# Patient Record
Sex: Female | Born: 1990 | Race: Black or African American | Hispanic: No | Marital: Single | Smoking: Former smoker
Health system: Southern US, Community
[De-identification: ages and names within clinical notes are randomized; demographics above are authoritative.]

## PROBLEM LIST (undated history)

## (undated) ENCOUNTER — Inpatient Hospital Stay (HOSPITAL_COMMUNITY): Payer: Self-pay

## (undated) DIAGNOSIS — E669 Obesity, unspecified: Secondary | ICD-10-CM

## (undated) DIAGNOSIS — R51 Headache: Secondary | ICD-10-CM

## (undated) DIAGNOSIS — I10 Essential (primary) hypertension: Secondary | ICD-10-CM

## (undated) DIAGNOSIS — F319 Bipolar disorder, unspecified: Secondary | ICD-10-CM

---
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
## 2008-06-15 ENCOUNTER — Ambulatory Visit: Payer: Self-pay | Admitting: Family Medicine

## 2008-07-19 ENCOUNTER — Emergency Department (HOSPITAL_COMMUNITY): Admission: EM | Admit: 2008-07-19 | Discharge: 2008-07-19 | Payer: Self-pay | Admitting: Emergency Medicine

## 2008-08-30 ENCOUNTER — Ambulatory Visit: Payer: Self-pay | Admitting: Family Medicine

## 2008-08-30 LAB — CONVERTED CEMR LAB
Bilirubin Urine: NEGATIVE
Glucose, Urine, Semiquant: NEGATIVE
Ketones, urine, test strip: NEGATIVE
Nitrite: NEGATIVE
Protein, U semiquant: NEGATIVE
RBC / HPF: >20
Specific Gravity, Urine: 1.02
Urobilinogen, UA: 0.2
WBC, UA: >20 cells/hpf
pH: 7

## 2009-05-15 ENCOUNTER — Ambulatory Visit: Payer: Self-pay | Admitting: Family Medicine

## 2009-05-15 LAB — CONVERTED CEMR LAB: Beta hcg, urine, semiquantitative: POSITIVE

## 2009-05-16 ENCOUNTER — Ambulatory Visit: Payer: Self-pay | Admitting: Family Medicine

## 2009-05-16 ENCOUNTER — Encounter: Payer: Self-pay | Admitting: Family Medicine

## 2009-05-16 LAB — CONVERTED CEMR LAB
ABO/RH(D): B POS
Antibody Screen: NEGATIVE
Basophils Absolute: 0 10*3/uL (ref 0.0–0.1)
Basophils Relative: 0 % (ref 0–1)
Eosinophils Absolute: 0.1 10*3/uL (ref 0.0–0.7)
Eosinophils Relative: 1 % (ref 0–5)
HCT: 39.5 % (ref 36.0–46.0)
Hemoglobin: 13 g/dL (ref 12.0–15.0)
Hepatitis B Surface Ag: NEGATIVE
Lymphocytes Relative: 35 % (ref 12–46)
Lymphs Abs: 2.8 10*3/uL (ref 0.7–4.0)
MCHC: 32.9 g/dL (ref 30.0–36.0)
MCV: 83.5 fL (ref 78.0–100.0)
Monocytes Absolute: 0.6 10*3/uL (ref 0.1–1.0)
Monocytes Relative: 7 % (ref 3–12)
Neutro Abs: 4.5 10*3/uL (ref 1.7–7.7)
Neutrophils Relative %: 57 % (ref 43–77)
Platelets: 355 10*3/uL (ref 150–400)
RBC: 4.73 M/uL (ref 3.87–5.11)
RDW: 14 % (ref 11.5–15.5)
Rh Type: POSITIVE
Rubella: 117.6 intl units/mL — ABNORMAL HIGH
Sickle Cell Screen: NEGATIVE
WBC: 8 10*3/uL (ref 4.0–10.5)

## 2009-05-20 ENCOUNTER — Telehealth: Payer: Self-pay | Admitting: Family Medicine

## 2009-05-21 ENCOUNTER — Ambulatory Visit: Payer: Self-pay | Admitting: Family Medicine

## 2009-05-21 ENCOUNTER — Telehealth: Payer: Self-pay | Admitting: Family Medicine

## 2009-06-13 ENCOUNTER — Ambulatory Visit: Payer: Self-pay | Admitting: Family Medicine

## 2009-06-13 ENCOUNTER — Encounter: Payer: Self-pay | Admitting: Family Medicine

## 2009-06-13 LAB — CONVERTED CEMR LAB
Chlamydia, DNA Probe: NEGATIVE
GC Probe Amp, Genital: NEGATIVE
Whiff Test: POSITIVE

## 2009-07-03 ENCOUNTER — Ambulatory Visit: Payer: Self-pay | Admitting: Family Medicine

## 2009-07-09 ENCOUNTER — Encounter: Payer: Self-pay | Admitting: Family Medicine

## 2009-07-09 ENCOUNTER — Ambulatory Visit: Payer: Self-pay | Admitting: Family Medicine

## 2009-07-31 ENCOUNTER — Ambulatory Visit (HOSPITAL_COMMUNITY): Admission: RE | Admit: 2009-07-31 | Discharge: 2009-07-31 | Payer: Self-pay | Admitting: Family Medicine

## 2009-07-31 ENCOUNTER — Encounter: Payer: Self-pay | Admitting: Family Medicine

## 2009-08-04 ENCOUNTER — Emergency Department (HOSPITAL_COMMUNITY): Admission: EM | Admit: 2009-08-04 | Discharge: 2009-08-05 | Payer: Self-pay | Admitting: Internal Medicine

## 2009-08-05 ENCOUNTER — Emergency Department (HOSPITAL_COMMUNITY): Admission: EM | Admit: 2009-08-05 | Discharge: 2009-08-05 | Payer: Self-pay | Admitting: Internal Medicine

## 2009-08-08 ENCOUNTER — Ambulatory Visit: Payer: Self-pay | Admitting: Family Medicine

## 2009-08-21 ENCOUNTER — Encounter: Payer: Self-pay | Admitting: Family Medicine

## 2009-08-21 ENCOUNTER — Telehealth: Payer: Self-pay | Admitting: Family Medicine

## 2009-08-21 ENCOUNTER — Ambulatory Visit: Payer: Self-pay | Admitting: Family Medicine

## 2009-08-21 ENCOUNTER — Ambulatory Visit (HOSPITAL_COMMUNITY): Admission: RE | Admit: 2009-08-21 | Discharge: 2009-08-21 | Payer: Self-pay | Admitting: Family Medicine

## 2009-09-09 ENCOUNTER — Ambulatory Visit: Payer: Self-pay | Admitting: Family Medicine

## 2009-10-11 ENCOUNTER — Ambulatory Visit: Payer: Self-pay | Admitting: Family Medicine

## 2009-10-11 ENCOUNTER — Encounter: Payer: Self-pay | Admitting: Family Medicine

## 2009-10-11 LAB — CONVERTED CEMR LAB
ALT: 14 units/L (ref 0–35)
AST: 15 units/L (ref 0–37)
Albumin: 3.3 g/dL — ABNORMAL LOW (ref 3.5–5.2)
Alkaline Phosphatase: 57 units/L (ref 39–117)
BUN: 7 mg/dL (ref 6–23)
Bilirubin Urine: NEGATIVE
CO2: 20 meq/L (ref 19–32)
Calcium: 8.7 mg/dL (ref 8.4–10.5)
Chloride: 104 meq/L (ref 96–112)
Creatinine, Ser: 0.49 mg/dL (ref 0.40–1.20)
Glucose, Bld: 98 mg/dL (ref 70–99)
Glucose, Urine, Semiquant: NEGATIVE
HCT: 35.4 % — ABNORMAL LOW (ref 36.0–46.0)
Hemoglobin: 11.5 g/dL — ABNORMAL LOW (ref 12.0–15.0)
Ketones, urine, test strip: NEGATIVE
MCHC: 32.5 g/dL (ref 30.0–36.0)
MCV: 84.7 fL (ref 78.0–100.0)
Nitrite: NEGATIVE
Platelets: 240 10*3/uL (ref 150–400)
Potassium: 3.9 meq/L (ref 3.5–5.3)
RBC: 4.18 M/uL (ref 3.87–5.11)
RDW: 14.3 % (ref 11.5–15.5)
Sodium: 135 meq/L (ref 135–145)
Specific Gravity, Urine: >=1.03
Total Bilirubin: 0.2 mg/dL — ABNORMAL LOW (ref 0.3–1.2)
Total Protein: 6.5 g/dL (ref 6.0–8.3)
Urobilinogen, UA: 0.2
WBC: 8.5 10*3/uL (ref 4.0–10.5)
pH: 6

## 2009-10-23 ENCOUNTER — Encounter: Payer: Self-pay | Admitting: Family Medicine

## 2009-10-25 ENCOUNTER — Encounter: Payer: Self-pay | Admitting: Family Medicine

## 2009-10-26 ENCOUNTER — Telehealth: Payer: Self-pay | Admitting: Family Medicine

## 2009-10-28 ENCOUNTER — Encounter: Payer: Self-pay | Admitting: Family Medicine

## 2009-11-06 ENCOUNTER — Encounter: Payer: Self-pay | Admitting: Family Medicine

## 2009-11-13 ENCOUNTER — Ambulatory Visit: Payer: Self-pay | Admitting: Family Medicine

## 2009-11-13 ENCOUNTER — Encounter: Payer: Self-pay | Admitting: Family Medicine

## 2009-11-13 LAB — CONVERTED CEMR LAB
ALT: 21 units/L (ref 0–35)
AST: 15 units/L (ref 0–37)
Albumin: 3.4 g/dL — ABNORMAL LOW (ref 3.5–5.2)
Alkaline Phosphatase: 88 units/L (ref 39–117)
BUN: 8 mg/dL (ref 6–23)
Bilirubin Urine: NEGATIVE
Blood in Urine, dipstick: NEGATIVE
CO2: 20 meq/L (ref 19–32)
Calcium: 8.7 mg/dL (ref 8.4–10.5)
Chloride: 103 meq/L (ref 96–112)
Creatinine, Ser: 0.52 mg/dL (ref 0.40–1.20)
Glucose, Bld: 84 mg/dL (ref 70–99)
Glucose, Urine, Semiquant: NEGATIVE
HCT: 35.2 % — ABNORMAL LOW (ref 36.0–46.0)
Hemoglobin: 11.6 g/dL — ABNORMAL LOW (ref 12.0–15.0)
Ketones, urine, test strip: NEGATIVE
MCHC: 33 g/dL (ref 30.0–36.0)
MCV: 82.6 fL (ref 78.0–100.0)
Nitrite: NEGATIVE
Platelets: 263 10*3/uL (ref 150–400)
Potassium: 4.3 meq/L (ref 3.5–5.3)
Protein, U semiquant: 30
RBC: 4.26 M/uL (ref 3.87–5.11)
RDW: 14.6 % (ref 11.5–15.5)
Sodium: 136 meq/L (ref 135–145)
Specific Gravity, Urine: >=1.03
Total Bilirubin: 0.3 mg/dL (ref 0.3–1.2)
Total Protein: 6.8 g/dL (ref 6.0–8.3)
Urobilinogen, UA: 0.2
WBC Urine, dipstick: NEGATIVE
WBC: 9.1 10*3/uL (ref 4.0–10.5)
pH: 6

## 2009-11-22 ENCOUNTER — Encounter: Payer: Self-pay | Admitting: Family Medicine

## 2009-11-23 ENCOUNTER — Telehealth: Payer: Self-pay | Admitting: Family Medicine

## 2009-11-29 ENCOUNTER — Ambulatory Visit: Payer: Self-pay | Admitting: Family Medicine

## 2009-12-19 ENCOUNTER — Encounter: Payer: Self-pay | Admitting: Family Medicine

## 2009-12-24 ENCOUNTER — Ambulatory Visit: Payer: Self-pay | Admitting: Family Medicine

## 2009-12-25 ENCOUNTER — Encounter: Payer: Self-pay | Admitting: Family Medicine

## 2009-12-25 LAB — CONVERTED CEMR LAB
Chlamydia, DNA Probe: NEGATIVE
GC Probe Amp, Genital: NEGATIVE

## 2009-12-26 ENCOUNTER — Encounter: Payer: Self-pay | Admitting: Family Medicine

## 2009-12-30 ENCOUNTER — Inpatient Hospital Stay (HOSPITAL_COMMUNITY): Admission: AD | Admit: 2009-12-30 | Discharge: 2009-12-30 | Payer: Self-pay | Admitting: Obstetrics & Gynecology

## 2009-12-30 ENCOUNTER — Telehealth: Payer: Self-pay | Admitting: *Deleted

## 2009-12-30 ENCOUNTER — Ambulatory Visit: Payer: Self-pay | Admitting: Family Medicine

## 2010-01-02 ENCOUNTER — Ambulatory Visit: Payer: Self-pay | Admitting: Family Medicine

## 2010-01-02 LAB — CONVERTED CEMR LAB
Glucose, Urine, Semiquant: NEGATIVE
Protein, U semiquant: NEGATIVE

## 2010-01-05 ENCOUNTER — Inpatient Hospital Stay (HOSPITAL_COMMUNITY): Admission: AD | Admit: 2010-01-05 | Discharge: 2010-01-10 | Payer: Self-pay | Admitting: Obstetrics and Gynecology

## 2010-01-05 ENCOUNTER — Ambulatory Visit: Payer: Self-pay | Admitting: Obstetrics & Gynecology

## 2010-01-07 ENCOUNTER — Encounter: Payer: Self-pay | Admitting: Obstetrics and Gynecology

## 2010-01-07 ENCOUNTER — Ambulatory Visit: Payer: Self-pay | Admitting: Family Medicine

## 2010-01-13 ENCOUNTER — Ambulatory Visit: Payer: Self-pay | Admitting: Cardiology

## 2010-01-13 ENCOUNTER — Inpatient Hospital Stay (HOSPITAL_COMMUNITY): Admission: AD | Admit: 2010-01-13 | Discharge: 2010-01-15 | Payer: Self-pay | Admitting: Obstetrics and Gynecology

## 2010-01-13 ENCOUNTER — Ambulatory Visit: Payer: Self-pay | Admitting: Family Medicine

## 2010-01-13 ENCOUNTER — Ambulatory Visit: Payer: Self-pay | Admitting: Obstetrics and Gynecology

## 2010-01-13 LAB — CONVERTED CEMR LAB
Bilirubin Urine: NEGATIVE
Glucose, Urine, Semiquant: NEGATIVE
Protein, U semiquant: 100
WBC Urine, dipstick: NEGATIVE
pH: 6

## 2010-01-14 ENCOUNTER — Telehealth: Payer: Self-pay | Admitting: Family Medicine

## 2010-01-15 ENCOUNTER — Encounter: Payer: Self-pay | Admitting: Obstetrics & Gynecology

## 2010-01-22 ENCOUNTER — Encounter: Payer: Self-pay | Admitting: Family Medicine

## 2010-02-14 ENCOUNTER — Emergency Department (HOSPITAL_COMMUNITY): Admission: EM | Admit: 2010-02-14 | Discharge: 2010-02-14 | Payer: Self-pay | Admitting: Emergency Medicine

## 2010-03-01 ENCOUNTER — Ambulatory Visit: Payer: Self-pay | Admitting: Psychiatry

## 2010-04-14 ENCOUNTER — Ambulatory Visit: Payer: Self-pay | Admitting: Family Medicine

## 2010-04-14 ENCOUNTER — Other Ambulatory Visit: Admission: RE | Admit: 2010-04-14 | Discharge: 2010-04-14 | Payer: Self-pay | Admitting: Family Medicine

## 2010-04-14 DIAGNOSIS — N912 Amenorrhea, unspecified: Secondary | ICD-10-CM | POA: Insufficient documentation

## 2010-04-22 ENCOUNTER — Telehealth: Payer: Self-pay | Admitting: *Deleted

## 2010-06-16 ENCOUNTER — Ambulatory Visit: Admit: 2010-06-16 | Payer: Self-pay

## 2010-06-16 ENCOUNTER — Ambulatory Visit
Admission: RE | Admit: 2010-06-16 | Discharge: 2010-06-16 | Payer: Self-pay | Source: Home / Self Care | Attending: Family Medicine | Admitting: Family Medicine

## 2010-06-16 DIAGNOSIS — G43909 Migraine, unspecified, not intractable, without status migrainosus: Secondary | ICD-10-CM | POA: Insufficient documentation

## 2010-06-20 ENCOUNTER — Emergency Department (HOSPITAL_COMMUNITY)
Admission: EM | Admit: 2010-06-20 | Discharge: 2010-06-20 | Payer: Self-pay | Source: Home / Self Care | Admitting: Family Medicine

## 2010-06-23 LAB — URINE CULTURE
Colony Count: 15000
Culture  Setup Time: 201201131729

## 2010-06-23 LAB — POCT URINALYSIS DIPSTICK
Bilirubin Urine: NEGATIVE
Ketones, ur: NEGATIVE mg/dL
Nitrite: NEGATIVE
Protein, ur: 30 mg/dL — AB
Specific Gravity, Urine: <=1.005 (ref 1.005–1.030)
Urine Glucose, Fasting: NEGATIVE mg/dL
Urobilinogen, UA: 0.2 mg/dL (ref 0.0–1.0)
pH: 6 (ref 5.0–8.0)

## 2010-06-23 LAB — POCT PREGNANCY, URINE: Preg Test, Ur: NEGATIVE

## 2010-06-25 ENCOUNTER — Emergency Department (HOSPITAL_COMMUNITY)
Admission: EM | Admit: 2010-06-25 | Discharge: 2010-06-25 | Payer: Self-pay | Source: Home / Self Care | Admitting: Emergency Medicine

## 2010-06-30 ENCOUNTER — Ambulatory Visit: Admit: 2010-06-30 | Payer: Self-pay

## 2010-06-30 LAB — URINALYSIS, ROUTINE W REFLEX MICROSCOPIC
Bilirubin Urine: NEGATIVE
Ketones, ur: NEGATIVE mg/dL
Leukocytes, UA: NEGATIVE
Nitrite: NEGATIVE
Protein, ur: NEGATIVE mg/dL
Specific Gravity, Urine: 1.005 (ref 1.005–1.030)
Urine Glucose, Fasting: NEGATIVE mg/dL
Urobilinogen, UA: 0.2 mg/dL (ref 0.0–1.0)
pH: 6 (ref 5.0–8.0)

## 2010-06-30 LAB — POCT PREGNANCY, URINE: Preg Test, Ur: NEGATIVE

## 2010-06-30 LAB — COMPREHENSIVE METABOLIC PANEL
ALT: 11 U/L (ref 0–35)
AST: 13 U/L (ref 0–37)
Albumin: 3.3 g/dL — ABNORMAL LOW (ref 3.5–5.2)
Alkaline Phosphatase: 45 U/L (ref 39–117)
BUN: 16 mg/dL (ref 6–23)
CO2: 25 mEq/L (ref 19–32)
Calcium: 8.5 mg/dL (ref 8.4–10.5)
Chloride: 108 mEq/L (ref 96–112)
Creatinine, Ser: 2.09 mg/dL — ABNORMAL HIGH (ref 0.4–1.2)
GFR calc Af Amer: 36 mL/min — ABNORMAL LOW (ref >60)
GFR calc non Af Amer: 30 mL/min — ABNORMAL LOW (ref >60)
Glucose, Bld: 92 mg/dL (ref 70–99)
Potassium: 4.1 mEq/L (ref 3.5–5.1)
Sodium: 141 mEq/L (ref 135–145)
Total Bilirubin: 0.2 mg/dL — ABNORMAL LOW (ref 0.3–1.2)
Total Protein: 7.2 g/dL (ref 6.0–8.3)

## 2010-06-30 LAB — DIFFERENTIAL
Basophils Absolute: 0.1 10*3/uL (ref 0.0–0.1)
Basophils Relative: 1 % (ref 0–1)
Eosinophils Absolute: 0.1 10*3/uL (ref 0.0–0.7)
Eosinophils Relative: 2 % (ref 0–5)
Lymphocytes Relative: 38 % (ref 12–46)
Lymphs Abs: 2.6 10*3/uL (ref 0.7–4.0)
Monocytes Absolute: 0.6 10*3/uL (ref 0.1–1.0)
Monocytes Relative: 9 % (ref 3–12)
Neutro Abs: 3.5 10*3/uL (ref 1.7–7.7)
Neutrophils Relative %: 51 % (ref 43–77)

## 2010-06-30 LAB — URINE MICROSCOPIC-ADD ON

## 2010-06-30 LAB — GLUCOSE, CAPILLARY: Glucose-Capillary: 99 mg/dL (ref 70–99)

## 2010-06-30 LAB — WET PREP, GENITAL
Clue Cells Wet Prep HPF POC: NONE SEEN
Trich, Wet Prep: NONE SEEN
Yeast Wet Prep HPF POC: NONE SEEN

## 2010-06-30 LAB — CBC
HCT: 34.5 % — ABNORMAL LOW (ref 36.0–46.0)
Hemoglobin: 11.2 g/dL — ABNORMAL LOW (ref 12.0–15.0)
MCH: 26.9 pg (ref 26.0–34.0)
MCHC: 32.5 g/dL (ref 30.0–36.0)
MCV: 82.7 fL (ref 78.0–100.0)
Platelets: 307 10*3/uL (ref 150–400)
RBC: 4.17 MIL/uL (ref 3.87–5.11)
RDW: 13.3 % (ref 11.5–15.5)
WBC: 6.9 10*3/uL (ref 4.0–10.5)

## 2010-06-30 LAB — GC/CHLAMYDIA PROBE AMP, GENITAL
Chlamydia, DNA Probe: NEGATIVE
GC Probe Amp, Genital: NEGATIVE

## 2010-06-30 LAB — LIPASE, BLOOD: Lipase: 23 U/L (ref 11–59)

## 2010-07-01 ENCOUNTER — Ambulatory Visit: Admission: RE | Admit: 2010-07-01 | Discharge: 2010-07-01 | Payer: Self-pay | Source: Home / Self Care

## 2010-07-01 ENCOUNTER — Encounter: Payer: Self-pay | Admitting: Family Medicine

## 2010-07-01 DIAGNOSIS — N179 Acute kidney failure, unspecified: Secondary | ICD-10-CM | POA: Insufficient documentation

## 2010-07-01 LAB — CONVERTED CEMR LAB
Ketones, urine, test strip: NEGATIVE
Nitrite: NEGATIVE
Urobilinogen, UA: 0.2

## 2010-07-02 ENCOUNTER — Encounter: Payer: Self-pay | Admitting: Family Medicine

## 2010-07-02 LAB — CONVERTED CEMR LAB
AST: 12 units/L (ref 0–37)
Albumin: 4.2 g/dL (ref 3.5–5.2)
Alkaline Phosphatase: 50 units/L (ref 39–117)
Glucose, Bld: 82 mg/dL (ref 70–99)
MCHC: 32.3 g/dL (ref 30.0–36.0)
Potassium: 4.2 meq/L (ref 3.5–5.3)
RDW: 13.8 % (ref 11.5–15.5)
Sodium: 139 meq/L (ref 135–145)
Total Protein: 7.7 g/dL (ref 6.0–8.3)

## 2010-07-04 ENCOUNTER — Telehealth: Payer: Self-pay | Admitting: Family Medicine

## 2010-07-08 NOTE — Progress Notes (Signed)
Summary: followup on ?water breaking  Phone Note Outgoing Call   Call placed by: [name] MD Details for Reason: Followup on ?Water breaking Summary of Call: Left message on phone to see if pt ever went to [facility] (nothing documented in echart-not hospitalized), to see if she is acutely in labor. Have attempted to contact on multiple attempts. Left return information for pt to call back if there were any questions or concerns.  [name] MD Oct 26, 2009 2:22 PM

## 2010-07-08 NOTE — Progress Notes (Signed)
Summary: Abd/RUQ pain  Phone Note Call from Patient   Details for Reason: Abdominal Pain Summary of Call: Recieved page/call from pt about pt reporting RUQ/abdominal pain. Pt states that pain started earlier today. Pt denies vaginal bleeding, LOF. Fetal movement at baseline. Pt denies HA, CP, vision change. Pt instructed that if abdominal pain worsens, vaginal bleeding, or fetal activity decreases to go to [facility] ED for evaluation. Pt also instructed to call [facility] line  in future if there are any emergencies or questions. [name] MD November 23, 2009 12:31 AM  Initial call taken by: [name] MD

## 2010-07-08 NOTE — Letter (Signed)
Summary: Handout Printed  Printed Handout:  - Prenatal-Record-CCC 

## 2010-07-08 NOTE — Assessment & Plan Note (Signed)
Summary: ob/kh   Vital Signs:  Patient profile:   20 year old female Weight:      282 pounds BP sitting:   104 / 60  Vitals Entered By: Arlyss Repress CMA, (August 08, 2009 2:16 PM)  Habits & Providers  Alcohol-Tobacco-Diet     Cigarette Packs/Day: n/a  Allergies: No Known Drug Allergies  Social History: Packs/Day:  n/a   Impression & Recommendations:  Problem # 1:  SUPERVISION OF NORMAL FIRST PREGNANCY (ICD-V22.0) 19 YO G1P0 here at 18.4 weeks dated by u/s @ 17.3 secondary to unsure LMP here  for routine prenatal visit. Pt w/o complaints or questions. Pt denies any headache, vision changes, vaginal bleeding. Pt does report minimal non-odorous vaginal discharge, minimal LE edema. Good fetal mov't/activity per pt.  Serologies: B+, Ab neg, H&H 13/39.5, HBsAg neg, RPR neg, RI, Sickle Cell neg, HIV NR, GC/Chl neg Feeding: breast Baby: female, outpt circ Pt educated about PTL, depression screen negative, plan for repeat u/s in 1-2 weeks for better cardiac visualization on u/s per ultrasound recommendations.   Orders: Prenatal U/S > 14 weeks - 66063 (Prenatal U/S) [facility]- Est Level  3 (01601)  Complete Medication List: 1)  Prenatal Vitamins 0.8 Mg Tabs (Prenatal multivit-min-fe-fa) .... One tab by mouth daily 2)  Tamiflu 75 Mg Caps (Oseltamivir phosphate) .... One by mouth two times a day x 5 days 3)  Pseudoephedrine Hcl 30 Mg Tabs (Pseudoephedrine hcl) .... One by mouth every 6 hours as needed for congestion   OB Initial Intake Information    Race: Black    Marital status: Single    Occupation: student  FOB Information    Husband/Father of baby: Karren Burly    FOB occupation unemployed    Phone: no phone  Menstrual History    LMP (date): 03/24/2009    Best Working EDC: 01/05/2010    LMP - Character: normal    LMP - Reliable? : approximate (month known)    Menarche: 10 years    Menses interval: 28 days    Menstrual flow 6-7 days    On BCP's at conception: no    Date  of positive (+) home preg. test: 05/22/2009   Flowsheet View for Follow-up Visit    Estimated weeks of       gestation:     18 4/7    Weight:     282    Blood pressure:   104 / 60    Headache:     No    Nausea/vomiting:   nausea    Edema:     0    Vaginal bleeding:   no    Vaginal discharge:   d/c    Fundal height:      17.5    FHR:       140s    Fetal activity:     yes    Labor symptoms:   no    Taking prenatal vits?   Y    Smoking:     n/a    Next visit:     4 wk    Resident:     Alvester Morin

## 2010-07-08 NOTE — Assessment & Plan Note (Signed)
Summary: open sore on neck/[place]/bolden   Vital Signs:  Patient profile:   20 year old female Height:      65 inches Weight:      289.8 pounds BMI:     48.40 Temp:     98.1 degrees F oral Pulse rate:   102 / minute BP sitting:   104 / 69  (right arm) Cuff size:   large  Vitals Entered By: Garen Grams LPN (August 21, 2009 3:57 PM) CC: open sore on neck Is Patient Diabetic? No Pain Assessment Patient in pain? no        Primary Care Provider:  Lequita Asal  MD  CC:  open sore on neck.  History of Present Illness: 1. Abscess on neck:  Pt has noticed a bump on her neck for the past 2 weeks.  Started off as a small bump then has gotten progressively bigger.  It started draining some fluid yesterday.  It is tender to palpation, warm.  It is still draining some fluid      ROS: denies fevers, headache, stiff neck  Current Problems (verified): 1)  Abscess, Skin  (ICD-682.9) 2)  Supervision of Normal First Pregnancy  (ICD-V22.0) 3)  Screening For Malignant Neoplasm of The Cervix  (ICD-V76.2) 4)  Influenza Like Illness  (ICD-487.1) 5)  Diabetes Mellitus, Type II, Family Hx  (ICD-V18.0) 6)  Pregnant State, Incidental  (ICD-V22.2) 7)  Obesity  (ICD-278.00) 8)  Bipolar Depression  (ICD-296.7) 9)  Eczema, Atopic Dermatitis  (ICD-691.8)  Current Medications (verified): 1)  Prenatal Vitamins 0.8 Mg Tabs (Prenatal Multivit-Min-Fe-Fa) .... One Tab By Mouth Daily 2)  Tamiflu 75 Mg Caps (Oseltamivir Phosphate) .... One By Mouth Two Times A Day X 5 Days 3)  Pseudoephedrine Hcl 30 Mg Tabs (Pseudoephedrine Hcl) .... One By Mouth Every 6 Hours As Needed For Congestion 4)  Keflex 500 Mg Caps (Cephalexin) .... Take 1 Tab By Mouth Twice A Day For 7 Days  Allergies: No Known Drug Allergies  Past History:  Past Medical History: Reviewed history from 10/18/2007 and no changes required. bipolar disorder  Social History: Reviewed history from 05/21/2009 and no changes required. 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 denies EtOH, drugs, tobacco.   Physical Exam  General:  vitals reviewed, afebrile.  alert and overweight-appearing.   Head:  normocephalic and atraumatic.   Neck:  supple, full ROM, and no masses.   Lungs:  normal respiratory effort.   Heart:  normal rate and regular rhythm.   Skin:  2x3 cm abscess on the back of the neck.  Draining some pustular material.  No overlying cellulitis.  Indurated area under abscess is approximately 3x4 cm. Additional Exam:  I&D neck abscess:  time out performed.  skin was prepped and draped in normal sterile fashion.  skin was anesthetized with lidocaine.  abscess with incised with sterile blade.  moderate amount of purulent material was expressed.  no packing was placed.  Covered with sterile gauze.   Impression & Recommendations:  Problem # 1:  ABSCESS, SKIN (ICD-682.9) Assessment New s/p I&D.  Will get culture.  Will treat with Keflex since she is pregnant. Her updated medication list for this problem includes:    Keflex 500 Mg Caps (Cephalexin) .Marland Kitchen... Take 1 tab by mouth twice a day for 7 days  Orders: Culture, Wound -[facility] (16109) Gram Stain-[facility] ([id number]) I&D Abcess, simple- [facility] (10060) [facility]- Est Level  3 (19147)  Complete Medication List: 1)  Prenatal Vitamins  0.8 Mg Tabs (Prenatal multivit-min-fe-fa) .... One tab by mouth daily 2)  Tamiflu 75 Mg Caps (Oseltamivir phosphate) .... One by mouth two times a day x 5 days 3)  Pseudoephedrine Hcl 30 Mg Tabs (Pseudoephedrine hcl) .... One by mouth every 6 hours as needed for congestion 4)  Keflex 500 Mg Caps (Cephalexin) .... Take 1 tab by mouth twice a day for 7 days  Patient Instructions: 1)  You have a skin abscess 2)  Your antibiotic is: Keflex 3)  Apply warm compresses to the affected area 3-4 times a day at least for 15-20 minutes. 4)  Do not squeeze the abscess. 5)  Wash your skin with antibacterial soap once a day to prevent these. 6)  Call our  office if it develops red streaks, is very painful, you have a fever, or for any other concerns. 7)  Please schedule a follow up appointment in 2 days to recheck the abscess 8)    Prescriptions: KEFLEX 500 MG CAPS (CEPHALEXIN) Take 1 tab by mouth twice a day for 7 days  #14 x 0   Entered and Authorized by:   Angelena Sole MD   Signed by:   Angelena Sole MD on 08/21/2009   Method used:   Electronically to        [facility] [id number]* (retail)       [address]       [place], [place]  [address]       Ph: [phone]       Fax: [phone]   RxID:   [id number]

## 2010-07-08 NOTE — Assessment & Plan Note (Signed)
Summary: OB F/U/[facility]   Vital Signs:  Patient profile:   20 year old female Height:      65 inches Weight:      296 pounds BMI:     49.44 Temp:     98.3 degrees F oral Pulse rate:   109 / minute BP sitting:   148 / 74  (right arm) Cuff size:   large  Vitals Entered By: Tessie Fass CMA (November 13, 2009 1:53 PM) CC: OB Visit Pain Assessment Patient in pain? no      LMP (date): 03/24/2009 [facility] 01/05/2010   CC:  OB Visit.  Habits & Providers  Alcohol-Tobacco-Diet     Cigarette Packs/Day: n/a  Allergies: No Known Drug Allergies   Impression & Recommendations:  Problem # 1:  SUPERVISION OF NORMAL FIRST PREGNANCY (ICD-V22.0) 20 YO G1P0 here at 32 3/7 w/ EDD 01/05/10 by U/S her for routine prenatal visit. Pt denies abd pain, vaginal bleeding, vaginal discharge, or LOF. No dysuria. Pt denies HA, vision changes, RUQ pain. Pt does report LE swelling. PHQ-9 score of 3. No HI/SI. No tobacco/substance abuse. No domestic violence per patient. Pt with noted 5 lb weight gain since last clinic visit 10/11/2009. Addressed need for diet low in processed/fast food and high in fruits and vegetables as well as overall weight gain goals during pregnancy for patient.. Pt agreeable. Fundal height large for dates in setting of baseline obese female-consistent w/ prior exams. Will continue to follow. Fetal heart tones WNL. Plan to also reassess pt for pre-eclampsia in setting of LE swelling, and weight gain. repeat manual BP 126/68-reassuring. However discussed Pre-X red flags w/ patient at length. Also discussed PTL red flags w/ pt. Pt currently undecided on birth control. Will readdress at next prenatal visit.  Plan to obtain CBC, RPR and HIV in conjunction w/ 3rd trimester labs in addition to Pre-eclampsia labs. Otherwise followup in 2 weeks. Pt agreeable to plan.   ? Water breaking- Pt w/ documented episode of ? water breaking 5/20-Calling in to clinic in tears. Pt states that it was just a false alarm.  She thinks she might have just wet herself... No abd pain, vaginal bleeding. + Fetal activity since ? episode.Marland KitchenMarland KitchenPTL Red flags readdressed w/ pt... Orders: Urinalysis-[facility] (00000) Comp Met-[facility] [id number]) CBC-[facility] [id number]) RPR-[facility] [id number]) HIV-[facility] [id number])  Complete Medication List: 1)  Prenatal Vitamins 0.8 Mg Tabs (Prenatal multivit-min-fe-fa) .... One tab by mouth daily 2)  Tamiflu 75 Mg Caps (Oseltamivir phosphate) .... One by mouth two times a day x 5 days 3)  Pseudoephedrine Hcl 30 Mg Tabs (Pseudoephedrine hcl) .... One by mouth every 6 hours as needed for congestion 4)  Keflex 500 Mg Caps (Cephalexin) .... Take 1 tab by mouth twice a day for 7 days   OB Initial Intake Information    Race: Black    Marital status: Single    Occupation: student  FOB Information    Husband/Father of baby: Karren Burly    FOB occupation unemployed    Phone: no phone  Menstrual History    LMP (date): 03/24/2009    Best Working EDC: 01/05/2010    LMP - Character: normal    Menarche: 10 years    Menses interval: 28 days    Menstrual flow 6-7 days    On BCP's at conception: no    Date of positive (+) home preg. test: 05/22/2009   Flowsheet View for Follow-up Visit    Estimated weeks of  gestation:     32 3/7    Weight:     296    Blood pressure:   148 / 74    Urine Protein:     30    Urine Glucose:   negative    Urine Nitrite:     negative    Headache:     No    Nausea/vomiting:   No    Edema:     1+LE    Vaginal bleeding:   no    Vaginal discharge:   no    Fundal height:      34.5    FHR:       140s    Fetal activity:     yes    Labor symptoms:   no    Taking prenatal vits?   Y    Smoking:     n/a    Next visit:     2 wk    Resident:     SN    Preceptor:     Outside Preceptor  Laboratory Results   Urine Tests  Date/Time Received: November 13, 2009 3:17 PM  Date/Time Reported: November 13, 2009 3:34 PM   Routine Urinalysis   Color: yellow Appearance:  Clear Glucose: negative   (Normal Range: Negative) Bilirubin: negative   (Normal Range: Negative) Ketone: negative   (Normal Range: Negative) Spec. Gravity: >=1.030   (Normal Range: 1.003-1.035) Blood: negative   (Normal Range: Negative) pH: 6.0   (Normal Range: 5.0-8.0) Protein: 30   (Normal Range: Negative) Urobilinogen: 0.2   (Normal Range: 0-1) Nitrite: negative   (Normal Range: Negative) Leukocyte Esterace: negative   (Normal Range: Negative)    Comments: ...........test performed by...........Marland KitchenTerese Door, CMA      Appended Document: Provider Charge Completion

## 2010-07-08 NOTE — Progress Notes (Signed)
Summary: records faxed/ts  ---- Converted from flag ---- ---- 12/26/2009 6:41 PM, [name] MD wrote: Please fax medical information for this pregnancy to [facility]. Thank you  Isaac Laud ------------------------------  faxed records and mailed via inter office mail

## 2010-07-08 NOTE — Assessment & Plan Note (Signed)
Summary: OB F/U [facility]   Vital Signs:  Patient profile:   20 year old female Height:      65 inches Weight:      276 pounds Pulse rate:   101 / minute BP sitting:   114 / 62  (right arm) Cuff size:   large  Vitals Entered By: Tessie Fass CMA (July 09, 2009 10:46 AM) CC: OB visit LMP - Reliable? YES Menarche (age onset years): 10   Menses interval (days): 28 Menstrual flow (days): 6-7 On BCP's at conception: no Date of + home preg. test: 05/22/2009 Last PAP Result ATYPICAL SQUAMOUS CELLS OF UNDETERMINED SIGNIFICANCE (ASC-US).   CC:  OB visit.  Allergies: No Known Drug Allergies  Physical Exam  General:  alert and overweight-appearing.   Head:  normocephalic and atraumatic.   Chest Wall:  no deformities.   Heart:  normal rate, regular rhythm, and no murmur.   Abdomen:  gravid abdomen   Impression & Recommendations:  Problem # 1:  SUPERVISION OF NORMAL FIRST PREGNANCY (ICD-V22.0) No acute issues during visit. Pt educated as to importance of healthy diet during pregnancy as well as post natal care. Ultrasound perfomed to confirm FHR in setting of poor tracing w/ poor surface tracing.                                                                                          Orders: [facility]- Est  Level 4 (16109) Ultrasound (Ultrasound)  Complete Medication List: 1)  Prenatal Vitamins 0.8 Mg Tabs (Prenatal multivit-min-fe-fa) .... One tab by mouth daily 2)  Tamiflu 75 Mg Caps (Oseltamivir phosphate) .... One by mouth two times a day x 5 days 3)  Pseudoephedrine Hcl 30 Mg Tabs (Pseudoephedrine hcl) .... One by mouth every 6 hours as needed for congestion   OB Initial Intake Information    Race: Black    Marital status: Single    Occupation: student  FOB Information    Husband/Father of baby: Karren Burly    FOB occupation unemployed    Phone: no phone  Menstrual History    LMP (date): 03/24/2009    Menarche: 10 years    Menses interval: 28 days    Menstrual  flow 6-7 days    On BCP's at conception: no    Date of positive (+) home preg. test: 05/22/2009    Pre Pregnancy Weight: 270 lbs.       Today's Evaluation EGA: [redacted]W[redacted]D  Fundal Ht: 16cm  FHT: 140s  WEIGHT: 276  BP: 114/62 Edema: 0  Fetal Activity: yes   Progress Note(s): Fund ht difficult to asess 2/2 large abdomen  Problems Assessed Assessed SUPERVISION OF NORMAL FIRST PREGNANCY as comment only - [name] MD  Allergies NKA    Flowsheet View for Follow-up Visit    Estimated weeks of       gestation:     [redacted]W[redacted]D    Weight:     276    Blood pressure:   114 / 62    Hx headache?     No    Nausea/vomiting?   No    Edema?  0    Bleeding?     no    Leakage/discharge?   no    Fetal activity:       yes    Labor symptoms?   no    Fundal height:      16cm    FHR:       140s    Comment:     Fund ht difficult to asess 2/2 large abdomen    Next visit:     4 wk    Resident:     SN    Preceptor:     breen    Flowsheet View for Follow-up Visit    Estimated weeks of       gestation:     [redacted]W[redacted]D    Weight:     276    Blood pressure:   114 / 62    Hx headache?     No    Nausea/vomiting?   No    Edema?     0    Bleeding?     no    Leakage/discharge?   no    Fetal activity:       yes    Labor symptoms?   no    Fundal height:      16cm    FHR:       140s    Comment:     Fund ht difficult to asess 2/2 large abdomen    Next visit:     4 wk    Resident:     SN    Preceptor:     breen   Genetic History     Thalassemia:     mother: no    Neural tube defect:   mother: no    Down's Syndrome:   mother: no    Tay-Sachs:     mother: no    Sickle Cell Dz/Trait:   mother: no    Hemophilia:     mother: no    Muscular Dystrophy:   mother: no    Cystic Fibrosis:   mother: no    Huntington's Dz:   mother: no    Mental Retardation:   mother: no    Fragile X:     mother: no    Other Genetic or       Chromosomal Dz:   mother: no    Child with other       birth defect:      mother: no    > 3 spont. abortions:   mother: no    Hx of stillbirth:     mother: yes  Infection Risk History    Rash, Viral, or Febrile Illness since LMP: no    Exposure to Cat Litter: no    Chicken Pox Immune Status: Hx of Disease: Immune    Occupational Exposure to Children: other  Environmental Exposures    Chemical or other exposure: no    Medication, drug, or alcohol use since LMP: no

## 2010-07-08 NOTE — Assessment & Plan Note (Signed)
Summary: ob/kh   Vital Signs:  Patient profile:   20 year old female Weight:      301 pounds BMI:     50.27 BSA:     2.36 BP sitting:   118 / 81  Vitals Entered By: Jone Baseman CMA (December 24, 2009 4:31 PM)  Primary Care Provider:  [name] MD   History of Present Illness: 20 YO G1P0 here at 65 2/7 weeks for routine OB visit. No acute complaints per tp. Excited about baby. Good social support per pt. Plans on breastfeeding. Wants mirena for birth control.No SI/HI.See flowsheet for full details.    Habits & Providers  Alcohol-Tobacco-Diet     Cigarette Packs/Day: n/a  Allergies: No Known Drug Allergies  Physical Exam  General:  alert and overweight-appearing.   Head:  normocephalic and atraumatic.   Nose:  no external deformity.   Mouth:  good dentition.   Neck:  large neck girth Lungs:  CTAB Heart:  RRR Abdomen:  gravid abdomen in setting of baseline obesity, FHT large for dates but consistent w/ previous measurements, fetal hear tones 140s.  Genitalia:  normal introitus, no external lesions, and vaginal discharge.   Extremities:  2-3+ pitting edema in LEs bilaterally. Neurologic:  alert & oriented X3    Impression & Recommendations:  Problem # 1:  SUPERVISION OF NORMAL FIRST PREGNANCY (ICD-V22.0) Plan to obtain GBS and repeat GC/Chl in setting of pt missing 36 week appt. Discussed labor planning at length w/ patient. Good fetal activity. Addressed weight gain w/ likely fluid component. BP and previous workups reassuring for no Pre-X-asymptomatic. Will followup in 1 week.  Orders: GC/Chlamydia-[facility] ([id number]) Grp B Probe-[facility] ([id number]) [facility]- Est Level  3 (65784)  Complete Medication List: 1)  Prenatal Vitamins 0.8 Mg Tabs (Prenatal multivit-min-fe-fa) .... One tab by mouth daily 2)  Tamiflu 75 Mg Caps (Oseltamivir phosphate) .... One by mouth two times a day x 5 days 3)  Pseudoephedrine Hcl 30 Mg Tabs (Pseudoephedrine hcl) .... One by mouth every 6  hours as needed for congestion 4)  Keflex 500 Mg Caps (Cephalexin) .... Take 1 tab by mouth twice a day for 7 days  Patient Instructions: 1)   It was good to see you today.  2)   If you have any labor type symptoms (Persistent severe lower abdominal cramping <10 mins apart, large leakage of fluid), go to the [facility] ED to be evaluated.  3)   Schedule an appt to see me in 1 week 4)   If you have any questions, please call the [facility] at [phone] 5)   When it is time for labor, please have the nurses page me at [phone] (Dr. Alvester Morin) 6)   God Bless, 7)   [name] MD   Flowsheet View for Follow-up Visit    Estimated weeks of       gestation:     38 2/7    Weight:     301    Blood pressure:   118 / 81    Hx headache?     No    Nausea/vomiting?   No    Edema?     2+LE    Bleeding?     no    Leakage/discharge?   no    Fetal activity:       yes    Labor symptoms?   no    Fundal height:      41.5    FHR:  140s    Fetal position:      vertex    Cx dilation:     0    Cx effacement:   0    Fetal station:     high    Taking Vitamins?   Y    Smoking PPD:   n/a    Next visit:     1 wk    Resident:     SN    Preceptor:     Jennette Kettle

## 2010-07-08 NOTE — Progress Notes (Signed)
Summary: triage  Phone Note Call from Patient Call back at Home Phone [phone]   Caller: Patient Summary of Call: can't come to appt today- overwhelmed - needs to talk to nurse Initial call taken by: De Nurse,  April 22, 2010 2:01 PM  Follow-up for Phone Call        Spoke with pt over the phone. Feels overwhelmed being being a mom in general. Had social services involved in the past. However, has to reapply to medicaid per pt for followup services. Is seeing psychiatrist. However has trouble with transportation at times. Pt instructed to followup with Dennison Nancy as to any additional resources available. Pt agreeable to plan. [name] MD April 22, 2010 2:55 PM  Follow-up by: [name] MD, April 22, 2010 2:54 PM

## 2010-07-08 NOTE — Assessment & Plan Note (Signed)
Summary: pp & remove staples,df   Vital Signs:  Patient profile:   20 year old female Height:      65 inches Weight:      302 pounds BMI:     50.44 O2 Sat:      94 % on Room air Temp:     98.8 degrees F oral BP sitting:   140 / 90  (right arm) Cuff size:   large  Vitals Entered By: Tessie Fass CMA (January 13, 2010 3:05 PM)  O2 Flow:  Room air CC: postpartum check Pain Assessment Patient in pain? yes     Location: lower back Intensity: 8  8  Primary Care Provider:  [name] MD  CC:  postpartum check.  History of Present Illness: 85 YOF G1P1 w/ recent c-section 01/07/10 secondary failed TOL as well as hx/o bipolar d/o here for staple removal and post-partum check:  Mood: Pt reports mood at baseline. No HI/SI. Pt's mother has been around to help w/ baby.   SOB: Pt reports SOB x1 day. No fevers, rash, LE pain. Does report intermittent CP. No hx/o asthma. Denies hx/o DVT. denies smoking. Pt dneies HA, Abd pain.   Allergies: No Known Drug Allergies  Physical Exam  General:  alert and overweight-appearing, minimal increased WOB  Head:  normocephalic and atraumatic.   Eyes:  vision grossly intact.   Ears:  R ear normal and L ear normal.   Nose:  no external deformity.   Mouth:  good dentition.   Neck:  large neck girth Lungs:  poor overall air mov't, no wheezes, rhoncii Heart:  RRR, no rubs, gallops, murmurs Abdomen:  obese, NT, stables on lower abdomen C/D/I/+bowel sounds Extremities:  2-3+ peripheral edema, no popliteal tenderness to palpation    Impression & Recommendations:  Problem # 1:  DYSPNEA (ICD-786.05) Concerning for PE in setting of postpartum state. Repeat manual BP @ 160/90 also concerning for Pre=X/ WIll obtain UA. Pt instructed to go to [facility] emergently for evalaution. Case reviewed w/ Dr. Jolayne Panther at [facility] and Dr. Mauricio Po.   Problem # 2:  POSTPARTUM EXAMINATION (ICD-V24.2) Staples removed w/o incident. post-removal cleaning routine reviewed w/pt.    Orders: [facility]- Est Level  3 ([address])  Problem # 3:  BIPOLAR DEPRESSION (ICD-296.7) Pt evaluated while at [facility] by psychiatry w/ no recommendation for medical management of bipolar d/o. Mood currently stable.  Will need close follow up in post partum state. May consider re-referal for psych eval w/ ? of starting SSRI in interim for post-partum depression ppx. WIll followup in 2 weeks  pending evalaution of PE. Pt agreeable.    Complete Medication List: 1)  Prenatal Vitamins 0.8 Mg Tabs (Prenatal multivit-min-fe-fa) .... One tab by mouth daily 2)  Tamiflu 75 Mg Caps (Oseltamivir phosphate) .... One by mouth two times a day x 5 days 3)  Pseudoephedrine Hcl 30 Mg Tabs (Pseudoephedrine hcl) .... One by mouth every 6 hours as needed for congestion 4)  Keflex 500 Mg Caps (Cephalexin) .... Take 1 tab by mouth twice a day for 7 days  Other Orders: Urinalysis-[facility] (00000)  Patient Instructions: 1)  It was good to see you today 2)  [facility] your shortness of breath  3)  Followup with me in 2 weeks to re-assess your mood 4)  I will otherwise get you in contact with our social worker for home resources 5)  If you have any further questions, feel free  to give [place] a call 6)  God Bless, 7)  [name] MD  Laboratory Results   Urine Tests  Date/Time Received: January 13, 2010 4:00 PM  Date/Time Reported: January 13, 2010 4:27 PM   Routine Urinalysis   Color: dark yellow Appearance: Clear Glucose: negative   (Normal Range: Negative) Bilirubin: moderate;  reflex ictotest = negative   (Normal Range: Negative) Ketone: >= (160)   (Normal Range: Negative) Spec. Gravity: >=1.030   (Normal Range: 1.003-1.035) Blood: large   (Normal Range: Negative) pH: 6.0   (Normal Range: 5.0-8.0) Protein: 100   (Normal Range: Negative) Urobilinogen: 1.0   (Normal Range: 0-1) Nitrite: negative   (Normal Range: Negative) Leukocyte Esterace: negative   (Normal Range:  Negative)  Urine Microscopic WBC/HPF: 5-10 RBC/HPF: 0-3 Bacteria/HPF: 2+ Epithelial/HPF: 5-15    Comments: ...............test performed by......Marland KitchenBonnie A. [place], MLS (ASCP)cm

## 2010-07-08 NOTE — Miscellaneous (Signed)
Summary: orders for GBS, gc,cz  Clinical Lists Changes  Orders: Added new Test order of GC/Chlamydia-[facility] (87591/87491) - Signed Added new Test order of Grp B Probe-[facility] ([address]) - Signed

## 2010-07-08 NOTE — Assessment & Plan Note (Signed)
Summary: OB F/U [facility]   Vital Signs:  Patient profile:   20 year old female Height:      65 inches Weight:      289 pounds Pulse rate:   93 / minute BP sitting:   118 / 72  (right arm) Cuff size:   large  Vitals Entered By: Tessie Fass CMA (September 09, 2009 2:44 PM) CC: OB Visit   CC:  OB Visit.  Allergies: No Known Drug Allergies   Impression & Recommendations:  Problem # 1:  SUPERVISION OF NORMAL FIRST PREGNANCY (ICD-V22.0) 19 YO G1P0 here routine prenatal followup. Pt currently w/ no concerns or questions. Addressed dietary changes and goal weight gain during pregnancy w/ pt in setting of pt w/ approx 15 lb weight gain since beginning of pregnancy. Plan to follow up in 4 weeks. Will get 1 hour glucola at that time.  Orders: [facility]- Est Level  3 (62130)  Complete Medication List: 1)  Prenatal Vitamins 0.8 Mg Tabs (Prenatal multivit-min-fe-fa) .... One tab by mouth daily 2)  Tamiflu 75 Mg Caps (Oseltamivir phosphate) .... One by mouth two times a day x 5 days 3)  Pseudoephedrine Hcl 30 Mg Tabs (Pseudoephedrine hcl) .... One by mouth every 6 hours as needed for congestion 4)  Keflex 500 Mg Caps (Cephalexin) .... Take 1 tab by mouth twice a day for 7 days   OB Initial Intake Information    Race: Black    Marital status: Single    Occupation: student  FOB Information    Husband/Father of baby: Karren Burly    FOB occupation unemployed    Phone: no phone  Menstrual History    LMP (date): 03/24/2009    LMP - Character: normal    Menarche: 10 years    Menses interval: 28 days    Menstrual flow 6-7 days    On BCP's at conception: no    Date of positive (+) home preg. test: 05/22/2009   Flowsheet View for Follow-up Visit    Estimated weeks of       gestation:     23 1/7    Weight:     289    Blood pressure:   118 / 72    Headache:     No    Nausea/vomiting:   nausea    Edema:     0    Vaginal bleeding:   no    Vaginal discharge:   no    Fundal height:       24.5    FHR:       140s    Fetal activity:     yes    Labor symptoms:   no    Flowsheet View for Follow-up Visit    Estimated weeks of       gestation:     23 1/7    Weight:     289    Blood pressure:   118 / 72    Hx headache?     No    Nausea/vomiting?   nausea    Edema?     0    Bleeding?     no    Leakage/discharge?   no    Fetal activity:       yes    Labor symptoms?   no    Fundal height:      24.5    FHR:       140s

## 2010-07-08 NOTE — Assessment & Plan Note (Signed)
Summary: NOB/DSL   Vital Signs:  Patient profile:   20 year old female Height:      65 inches Weight:      270 pounds Pulse rate:   100 / minute BP sitting:   142 / 82  (left arm) Cuff size:   large  Vitals Entered By: Tessie Fass CMA (June 13, 2009 2:13 PM) CC: new ob   CC:  new ob.  Allergies: No Known Drug Allergies  Physical Exam  General:  alert and overweight-appearing.   Head:  normocephalic and atraumatic.   Eyes:  vision grossly intact.   Nose:  no external deformity.   Mouth:  good dentition.   Neck:  supple and full ROM.   Lungs:  normal respiratory effort.   Heart:  normal rate, regular rhythm, no murmur, and no gallop.   Abdomen:  obese abdomen, S/NT Genitalia:  no vaginal discharge, mucosa pink and moist, and no vaginal or cervical lesions. minimal/subjective adnexal tenderness likely secondary to discomfort of exam.  Msk:  normal ROM and no joint tenderness.   Extremities:  no edema Neurologic:  alert & oriented X3 and cranial nerves II-XII intact.     Impression & Recommendations:  Problem # 1:  PREGNANT STATE, INCIDENTAL (ICD-V22.2) Pt presents 40 mins late into 60 min office visit. Addressed w/ pt need for on time appt as for appropriate followup and obtaining history. Unable to obtain appropriate history given brevity of time during this visit. Will obtain preliminary pap smear, wet prep, GC/Chl screen. Will also order prenatal u/s for accurate dating. Stressed with pt need for prompt followup in 1-2 weeks to obtain detailed appropriate history.  Orders: GC/Chlamydia-[facility] (87591/87491) Pap Smear-[facility] ([address]) Wet Prep- [facility] (09811) Prenatal U/S < 14 weeks - 91478  (Prenatal U/S) Glucose 1 hr-[facility] (82950) [facility]- Est  Level 4 (29562)  Complete Medication List: 1)  Prenatal Vitamins 0.8 Mg Tabs (Prenatal multivit-min-fe-fa) .... One tab by mouth daily 2)  Tamiflu 75 Mg Caps (Oseltamivir phosphate) .... One by mouth two times a day x 5 days 3)   Pseudoephedrine Hcl 30 Mg Tabs (Pseudoephedrine hcl) .... One by mouth every 6 hours as needed for congestion  Patient Instructions: 1)  Please schedule a follow-up appointment in 2 weeks.    New Problems SCREENING FOR MALIGNANT NEOPLASM OF THE CERVIX (ICD-V76.2)   Problems Assessed Assessed PREGNANT STATE, INCIDENTAL as comment only - [name] MD  Allergies NKA    OB Initial Intake Information    Race: Black    Marital status: Single   Flowsheet View for Follow-up Visit    Weight:     270    Blood pressure:   142 / 82    Laboratory Results  Date/Time Received: June 13, 2009 2:41 PM  Date/Time Reported: June 13, 2009 2:50 PM   [employer] Source: vag WBC/hpf: <5 Bacteria/hpf: 3+  Cocci Clue cells/hpf: many  Positive whiff Yeast/hpf: none Trichomonas/hpf: none Comments: ...............test performed by......Marland KitchenBonnie A. [place], MLS (ASCP)cm     Prevention & Chronic Care Immunizations   Influenza vaccine: Fluvax Non-MCR  (05/15/2009)    Tetanus booster: Not documented    Pneumococcal vaccine: Not documented  Other Screening   Pap smear: Not documented   Pap smear action/deferral: Ordered  (06/13/2009)   Pap smear due: 06/13/2009   Smoking status: never  (05/21/2009)

## 2010-07-08 NOTE — Assessment & Plan Note (Signed)
Summary: abd pain,df   Vital Signs:  Patient profile:   20 year old female Height:      65 inches Weight:      281 pounds BMI:     46.93 Temp:     98.3 degrees F oral BP sitting:   115 / 70  (left arm) Cuff size:   large  Vitals Entered By: Tessie Fass CMA (April 14, 2010 8:37 AM) CC: abdominal pain Is Patient Diabetic? No Pain Assessment Patient in pain? yes     Location: abdomen Intensity: 7   Primary Care Provider:  [name] MD  CC:  abdominal pain.  History of Present Illness: 20 YO G1P1 here for postpartum exam: No nausea, vomiting, dysuria, fever per pt. No dyspnea/CP.  Incision site C/D/I w/o discharge or erythema. Not breastfeeding. Pt does report intermittent abd pain. Mainly in epigastric and LLQ. Pt also with decreased BMs/hard stools. 2-3 BMs per week per pt.  Mood: Pt reports mood much improved w/ abilify(unsure of dose). No HI/SI. Sees outpatient therapist (unsure of name). Feels that she has more energy.    Allergies: No Known Drug Allergies  Physical Exam  General:  alert and overweight-appearing.   Head:  normocephalic and atraumatic.   Neck:  supple, large neck girth Lungs:  CTAB, no wheezes, rales, rhoncii Heart:  RRR Abdomen:  obese, non tender, lower incision C/D/I. Well healed. Genitalia:  no external lesions and mucosa pink and moist. + blood in cervical os. No CMT.  Extremities:  2+ peripheral pulses, no LE edema  Neurologic:  alert & oriented X3 and cranial nerves II-XII intact.   Psych:  Oriented X3, good eye contact, not anxious appearing, and not depressed appearing.     Impression & Recommendations:  Problem # 1:  POSTPARTUM EXAMINATION (ICD-V24.2) Overall normal postpartum examination. Abd pain likely secondary to constipation. Will rx daily miralax. Pt desires IUD. However did not recieve depo at hospital d/c. Pt does reprot + unprotected sexual activity w/in 24 hours. Pt does not want pregnancy. Offered plan B/requested.  Will have pt return in 1 week for repeat  upreg and IUD placement. Pt agreeable to plan.   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 Will repeat pap.  Orders: Pap Smear-[facility] ([id number])  Problem # 3:  BIPOLAR DEPRESSION (ICD-296.7) Mood currently well improved per pt w/ abilify. Encuraged pt to obtain abilify medication dose and therapy clinic name as to ensure proper clinical communication. Pt agreeable.   Complete Medication List: 1)  Prenatal Vitamins 0.8 Mg Tabs (Prenatal multivit-min-fe-fa) .... One tab by mouth daily 2)  Tamiflu 75 Mg Caps (Oseltamivir phosphate) .... One by mouth two times a day x 5 days 3)  Pseudoephedrine Hcl 30 Mg Tabs (Pseudoephedrine hcl) .... One by mouth every 6 hours as needed for congestion 4)  Keflex 500 Mg Caps (Cephalexin) .... Take 1 tab by mouth twice a day for 7 days 5)  Miralax Powd (Polyethylene glycol 3350) .Marland KitchenMarland Kitchen. 17 gm in 8 ozs of water/juice daily  Other Orders: U Preg-[facility] (09811)  Patient Instructions: 1)  It was good to see your today 2)  I will prescribe you the plan B pill which you can take if you choose to terminate a pregnancy 3)  Come back in 1 week for an IUD to be placed 4)  Your abdominal pain is likely from constipation. Take 1 capful of miralax daily  5)  Use tylenol for your headach eas needed, we will talk about your headache furhter at other visits. 6)  If you have any other questions, please give [place] a call 7)  [name] MD  Prescriptions: PLAN B ONE-STEP 1.5 MG TABS (LEVONORGESTREL) 1 tab by mouth x 1  #1 x 0   Entered and Authorized by:   [name] MD   Signed by:   [name] MD on 04/14/2010   Method used:   Electronically to        [facility] [phone]* (retail)       [address]       [place], [place]  [address]       Ph: [phone]       Fax: [phone]   RxID:   [id number]    Orders Added: 1)  U Preg-[facility]  [81025] 2)  Pap [facility] [place] [facility] [[id number]]    Laboratory Results   Urine Tests  Date/Time Received: April 14, 2010 9:42 AM  Date/Time Reported: April 14, 2010 9:56 AM     Urine HCG: negative Comments: ...............test performed by......Marland KitchenBonnie A. [place], MLS (ASCP)cm     Appended Document: abd pain,df    Clinical Lists Changes  Orders: Added new Test order of [facility]- Est Level  3 (36644) - Signed

## 2010-07-08 NOTE — Miscellaneous (Signed)
Summary: water broke?  Clinical Lists Changes water broke 11:30 this am. large amounts of clear fluid. she does not think it is urine. crying & very upset. she is 30 wks per pt. told her to call 911 & lay down on L side until they get there. [facility] not [facility]. she agreed.[address]en [address] RN  Oct 25, 2009 1:37 PM

## 2010-07-08 NOTE — Miscellaneous (Signed)
  Clinical Lists Changes  Problems: Removed problem of ABSCESS, SKIN (ICD-682.9) Removed problem of SCREENING FOR MALIGNANT NEOPLASM OF THE CERVIX (ICD-V76.2) Removed problem of INFLUENZA LIKE ILLNESS (ICD-487.1) Removed problem of PREGNANT STATE, INCIDENTAL (ICD-V22.2)

## 2010-07-08 NOTE — Letter (Signed)
Summary: Generic Letter  [facility]  [address]   [place], [place] [address]   Phone: [phone]  Fax: [phone]    07/09/2009  Joan Moore [address] [place], [place]  [address]  To whom it may concern,[name] is pregnant with an expected due date of            Sincerely,   [name] MD

## 2010-07-08 NOTE — Progress Notes (Signed)
Summary: phn msg  Phone Note Call from Patient Call back at [phone]   Caller: mom-Joan Moore Summary of Call: pt has pulmenary edema - needs paperwork filled out for mom Joan Moore) needs that asap to take to work Initial call taken by: De Nurse,  January 14, 2010 2:11 PM

## 2010-07-08 NOTE — Assessment & Plan Note (Signed)
Summary: OB/KH   Vital Signs:  Patient profile:   20 year old female LMP:     03/24/2009 Height:      65 inches Weight:      276 pounds Pulse rate:   96 / minute BP sitting:   119 / 71  (right arm) Cuff size:   large  Vitals Entered By: Tessie Fass, CMA (July 03, 2009 3:02 PM) LMP (date): 03/24/2009 EDC by LMP==> 12/29/2009 EDC 12/29/2009 LMP - Reliable? YES Menarche (age onset years): 10   Menses interval (days): 28 Menstrual flow (days): 6-7 On BCP's at conception: no Enter LMP: 03/24/2009 Last PAP Result ATYPICAL SQUAMOUS CELLS OF UNDETERMINED SIGNIFICANCE (ASC-US).   Allergies: No Known Drug Allergies  Physical Exam  General:  overweight-appearing.  alert and overweight-appearing.  overweight-appearing.  overweight-appearing.     Complete Medication List: 1)  Prenatal Vitamins 0.8 Mg Tabs (Prenatal multivit-min-fe-fa) .... One tab by mouth daily 2)  Tamiflu 75 Mg Caps (Oseltamivir phosphate) .... One by mouth two times a day x 5 days 3)  Pseudoephedrine Hcl 30 Mg Tabs (Pseudoephedrine hcl) .... One by mouth every 6 hours as needed for congestion  Other Orders: Ultrasound (Ultrasound) [facility]- Est Level  3 ([address])   Past Pregnancy History    Gravida:     1    Term Births:     0    Premature Births:   0    Living Children:   0    Para:       0    Mult. Births:     0    Prev C-Section:   0    Aborta:     0    Elect. Ab:     0    Spont. Ab:     0    Ectopics:     0   Initial Obstetrical Visit HPI Joan Moore is a 20 Years Old G: 1 P: 0 0 0 0  with an EDC of 12/29/2009 giving her a GA of [redacted]W[redacted]D wks. She is YES about the date of her LMP. She has had no spontaneous abortions. She has not had any elective abortions. Patient has no history of ectopic pregnancies.  {PCP()   <-FUNCTION DEFINITION IS NOT EXECUTABLE IMPRESSION {IF currentpregobs("EDC",currentpreg) == "FALSE" AND OBSANY("EDC")=="" THEN "" ELSE IF currentpregobs("EDC",currentpreg)=="FALSE" AND  OBSANY("EDC")<>"" THEN "" ELSE IF currentpregobs("Est deliv da",currentpreg)=="TRUE" AND OBSANY("EDC")=="" THEN "" ELSE fmt("EDC: " + OBSANY("EDC") + " FINAL    ","B,U") ENDIF ENDIF ENDIF + IF currentpregobs("EDC",currentpreg)=="TRUE" AND OBSANY("EDC")<>"" THEN "" ELSE IF OBSANY("Est deliv da")="" AND currentpregobs("Est deliv da",currentpreg)=="FALSE" THEN "" ELSE IF OBSANY("Est deliv da")<>"" AND currentpregobs("Est deliv da",currentpreg)=="FALSE" THEN "" ELSE IF currentpregobs("Est deliv da",currentpreg)=="TRUE" AND OBSANY("Est deliv da")=="" THEN "" ELSE FMT("EDC: " + OBSANY("Est deliv da") + " INITIAL    ","B,U") ENDIF ENDIF ENDIF ENDIF <-FUNCTION DEFINITION IS NOT EXECUTABLE{IF pregnow() == "Yes by Final EDC" AND OBSANY("Gest age est")<>"" THEN FMT("EGA: ","B,U") + FMT(OBSANY("Gest age est"),"B,U") + FMT(" wks","B,U") ELSE IF pregnow() == "Yes by Initial EDC" AND OBSANY("Gest age est")<>"" THEN FMT("EGA: ","B,U") + FMT(OBSANY("Gest age est"),"B,U") + fmt(" wks","B,U") ELSE "" ENDIF ENDIF <-FUNCTION DEFINITION IS NOT EXECUTABLE NEW PROBLEMS: SUPERVISION OF NORMAL FIRST PREGNANCY (ICD-V22.0)  No  PLANS Diagnostic Studies: Morphology scan ordered.  Education: Nutritional Counseling.  MEDICATIONS PRENATAL VITAMINS 0.8 MG TABS (PRENATAL MULTIVIT-MIN-FE-FA) one tab by mouth daily TAMIFLU 75 MG CAPS (OSELTAMIVIR PHOSPHATE) one by mouth two times a day x 5 days PSEUDOEPHEDRINE HCL 30 MG TABS (PSEUDOEPHEDRINE  HCL) one by mouth every 6 hours as needed for congestion  OVERALL PLANS FOR PREGNANCY      Flowsheet View for Follow-up Visit    Estimated weeks of       gestation:     [redacted]W[redacted]D    Weight:     276    Blood pressure:   119 / 71     Pregnancy History Total Preg.: 1  Full Term: 0  Premature: 0  Elect Ab: 0  Spontaneous Ab: 0   Ectopics: 0  Multiple Births: 0  Living: 0  Today's Evaluation EGA: [redacted]W[redacted]D  WEIGHT: 276  BP: 119/71  New Problems SUPERVISION OF NORMAL FIRST PREGNANCY  (ICD-V22.0)   Allergies NKA       Vital Signs:  Patient Profile:   20 year old female LMP:     03/24/2009 Height:     65 inches Weight:      276 pounds Pulse rate:   96 / minute BP sitting:   119 / 71 Cuff size:   large Height (in): 65    Appearance: overweight-appearing.  alert and overweight-appearing.  overweight-appearing.  overweight-appearing.     OB Initial Intake Information    Race: Black    Marital status: Single    Occupation: student  FOB Information    Husband/Father of baby: Karren Burly    FOB occupation unemployed    Phone: no phone  Menstrual History    LMP (date): 03/24/2009    EDC by LMP: 12/29/2009    Best Working EDC: 12/29/2009    LMP - Reliable? : YES    Menarche: 10 years    Menses interval: 28 days    Menstrual flow 6-7 days    On BCP's at conception: no    Symptoms since LMP: nausea   Prenatal Visit    FOB name: Karren Burly [facility] Confirmation:    New working [facility]: 12/29/2009    LMP reliable? YES    Last menses onset (LMP) date: 03/24/2009    EDC by LMP: 12/29/2009    OB Initial Intake Information    Race: Black    Marital status: Single    Occupation: student  FOB Information    Husband/Father of baby: Karren Burly    FOB occupation unemployed    Phone: no phone  Menstrual History    LMP (date): 03/24/2009    EDC by LMP: 12/29/2009    Best Working EDC: 12/29/2009    LMP - Reliable? : YES    Menarche: 10 years    Menses interval: 28 days    Menstrual flow 6-7 days    On BCP's at conception: no    Symptoms since LMP: nausea     Flowsheet View for Follow-up Visit    Estimated weeks of       gestation:     [redacted]W[redacted]D    Weight:     276    Blood pressure:   119 / 71    Prenatal Visit    FOB name: Karren Burly [facility] Confirmation:    New working [facility]: 12/29/2009    LMP reliable? YES    Last menses onset (LMP) date: 03/24/2009    EDC by LMP: 12/29/2009    Appended Document:  OB/KH     Allergies: No Known Drug Allergies  Physical Exam  General:  alert and overweight-appearing.   Head:  normocephalic and atraumatic.   Mouth:  good dentition.   Lungs:  normal respiratory effort, no intercostal retractions,  and no accessory muscle use.   Heart:  normal rate, regular rhythm, no murmur, and no gallop.   Abdomen:  obese. FHT difficult to assess secondary to obese abdomen. Fetal heart rate intermittently in the 140s.    Impression & Recommendations:  Problem # 1:  SUPERVISION OF NORMAL FIRST PREGNANCY (ICD-V22.0) No current acute issues. Discussed importance of nutrition w/ pt. Pt currently w/ no questions or concerns.   Complete Medication List: 1)  Prenatal Vitamins 0.8 Mg Tabs (Prenatal multivit-min-fe-fa) .... One tab by mouth daily 2)  Tamiflu 75 Mg Caps (Oseltamivir phosphate) .... One by mouth two times a day x 5 days 3)  Pseudoephedrine Hcl 30 Mg Tabs (Pseudoephedrine hcl) .... One by mouth every 6 hours as needed for congestion    Flowsheet View for Follow-up Visit    Estimated weeks of       gestation:     15 1/7    OB Initial Intake Information    Race: Black    Marital status: Single    Occupation: student  FOB Information    Husband/Father of baby: Karren Burly    FOB occupation unemployed    Phone: no phone  Menstrual History    LMP (date): 03/24/2009    Menarche: 10 years    Menses interval: 28 days    Menstrual flow 6-7 days    On BCP's at conception: no    Symptoms since LMP: nausea

## 2010-07-08 NOTE — Letter (Signed)
Summary: Generic Letter  [facility]  [address]   [place], [place] [address]   Phone: [phone]  Fax: [phone]    10/23/2009  Joan Moore [address] [place], [place]  [address]  Dear Ms. Dragone, I wanted to inform you that we found a specific bacteria in your urine called trichomonas. It is usually associated with sexual activity and will need to be treated; especially that you are pregnant. I have sent a prescription to your pharmacy to treat the bacteria. We will discuss this further at your next prenatal visit. However, if you have any questions before then, please feel free to contact the [facility] at your earliest convenience.            Sincerely,   [name] MD  Appended Document: Generic Letter mailed

## 2010-07-08 NOTE — Miscellaneous (Signed)
Summary: GBS +  Clinical Lists Changes  Problems: Added new problem of GROUP B STREPTOCOCCUS INFECTION (ICD-041.02)

## 2010-07-08 NOTE — Miscellaneous (Signed)
Summary: FMLA-Completed  pts mom dropped off form to be completed, placed on triage desk for any clinical info to be completed. Denny Peon Odell  January 22, 2010 3:42 PM   to pcp.Golden Circle RN  January 22, 2010 4:40 PM  Forms completed in clinic 01/24/10 [name] MD January 26, 2010 9:15 PM

## 2010-07-08 NOTE — Assessment & Plan Note (Signed)
Summary: ob/kh   Vital Signs:  Patient profile:   20 year old female Height:      65 inches Weight:      294 pounds Temp:     98.3 degrees F oral Pulse rate:   111 / minute BP sitting:   118 / 68  (right arm) Cuff size:   large  Vitals Entered By: Tessie Fass CMA (November 29, 2009 2:47 PM) CC: OB   Primary Care Provider:  Lequita Asal  MD  CC:  OB.  History of Present Illness: 20 YO G1P0 here at 18 5/7 w/ EDD 01/05/10 by U/S her for routine prenatal visit. Pt denies abd pain, vaginal bleeding, vaginal discharge, or LOF. No dysuria. Pt denies HA, vision changes, RUQ pain. Pt does report LE swelling. PHQ-9 score of 15. Though HI/SI. Pt states that she is just "tired of being pregnant"No tobacco/substance abuse. No domestic violence per patient. Pt with noted 2 lb weight loss since last clinic visit 11/13/2009.  Habits & Providers  Alcohol-Tobacco-Diet     Cigarette Packs/Day: n/a  Allergies: No Known Drug Allergies  Physical Exam  General:  overweight-appearing.   Head:  normocephalic and atraumatic.   Eyes:  vision grossly intact.   Mouth:  good dentition.   Neck:  supple and full ROM.   Lungs:  normal respiratory effort.   Heart:  normal rate and regular rhythm.   Abdomen:  Gravid abdomen, Fundal Height at 37.5 cm, Fetal Heart Tones in 140s Extremities:  1+ edema bilaterally   Impression & Recommendations:  Problem # 1:  SUPERVISION OF NORMAL FIRST PREGNANCY (ICD-V22.0) Noted 2 lb weight loss since last visit, Pt encouraged and reinforceed need for low fat-high fruit and vegetable diet. Fundal height large for dates in setting of baseline obese female-consistent w/ prior exams. Will continue to follow. Fetal heart tones WNL.  No HI or SI which is reassuring. Addressed w/ pt that matermal fatigue/"tired of being pregnant" is very common into 3rd trimester as due date approaches. Will reasses at next clinic visit. Reviewed social resources and psych red flags w/ pt in  setting of baseline hx/o bipolar depression. Discussed Pre-X labs from previous visit  w/ patient at length-WNL. Discussed PTL signs w/ patient. Pt still undecided on birth control. Options reviewed.  Will readdress at next prenatal visit. Pt instructed to follow up in OB clinic. Pt agreeable to plan. Plan to obtain GBS at 37 week visit.  Orders: [facility]- Est Level  3 (04540)  Complete Medication List: 1)  Prenatal Vitamins 0.8 Mg Tabs (Prenatal multivit-min-fe-fa) .... One tab by mouth daily 2)  Tamiflu 75 Mg Caps (Oseltamivir phosphate) .... One by mouth two times a day x 5 days 3)  Pseudoephedrine Hcl 30 Mg Tabs (Pseudoephedrine hcl) .... One by mouth every 6 hours as needed for congestion 4)  Keflex 500 Mg Caps (Cephalexin) .... Take 1 tab by mouth twice a day for 7 days  Patient Instructions: 1)  It was good to see you today.  2)  If you have any labor type symptoms (Persistent severe lower abdominal cramping <10 mins apart, large leakage of fluid), go to the [facility] ED to be evaluated.  3)  You are having dyspnea of pregnancy. This will improve after the baby is born.  4)  Try eating a high fruit and vegetable diet 5)  Schedule an appt for OB clinic in 2 weeks 6)  If you have any questions, please call the [facility] at  [phone] 7)  When it is time for labor, please have the nurses page me at [phone] (Dr. Alvester Morin) 8)  God Bless, 9)  Joan Albee MD   OB Initial Intake Information    Race: Black    Marital status: Single    Occupation: student  FOB Information    Husband/Father of baby: Karren Burly    FOB occupation unemployed    Phone: no phone  Menstrual History    LMP (date): 03/24/2009    LMP - Character: normal    Menarche: 10 years    Menses interval: 28 days    Menstrual flow 6-7 days    On BCP's at conception: no    Date of positive (+) home preg. test: 05/22/2009   Flowsheet View for Follow-up Visit    Estimated weeks of       gestation:     34 5/7     Weight:     294    Blood pressure:   118 / 68    Headache:     few    Nausea/vomiting:   No    Edema:     1+LE    Vaginal bleeding:   no    Vaginal discharge:   d/c    Fundal height:      37.5    FHR:       140s    Fetal activity:     yes    Labor symptoms:   no    Taking prenatal vits?   Y    Smoking:     n/a    Next visit:     2 wk    Resident:     SN    Preceptor:     Jennette Kettle  Appended Document: ob/kh HPI Correction: NO HI/SI

## 2010-07-08 NOTE — Progress Notes (Signed)
Summary: triage  Phone Note Call from Patient Call back at Home Phone [phone]   Caller: Patient Summary of Call: Had place on back of neck that busted and clear and yellow stuff came out.  Should she be seen? Initial call taken by: Clydell Hakim,  August 21, 2009 2:42 PM  Follow-up for Phone Call        it had been present 2 wks before it opened. still painful. appt today at 4:15 with Dr. Lelon Perla Follow-up by: Golden Circle RN,  August 21, 2009 2:47 PM

## 2010-07-08 NOTE — Assessment & Plan Note (Signed)
Summary: OB/KH   Vital Signs:  Patient profile:   20 year old female Height:      65 inches Weight:      291 pounds BMI:     48.60 Temp:     97.9 degrees F oral Pulse rate:   104 / minute BP sitting:   112 / 57  (left arm) Cuff size:   large  Vitals Entered By: Tessie Fass CMA (Oct 11, 2009 2:01 PM) CC: OB Visit Pain Assessment Patient in pain? no        CC:  OB Visit.  Allergies: No Known Drug Allergies  Physical Exam  General:  overweight-appearing, NAD Head:  NCAT Eyes:  vision grossly intact, no scleral icterus   Neck:  large neck girth. Supple, full ROM Lungs:  CTAB Heart:  RRR Abdomen:  gravid abdomen, No RUQ tenderness on palptaion.  Extremities:  2+ peripheral pulses, minimal edema.  Neurologic:  alert & oriented X3 and strength normal in all extremities. grossly normal neurological exam   Impression & Recommendations:  Problem # 1:  SUPERVISION OF NORMAL FIRST PREGNANCY (ICD-V22.0) 19 G1PO here at 27 5/7  weeks here for routine OB visit. Pt reports intermittent HA and sporadic vision changes(occasional blurry vision), RUQ pain, and subjective LE swelling. Plan to obtain Pre-X labs to rule out disease. PE and VS reassuring as pt w/o RUQ tenderness, visual disturbance, minimal LE edema. BP WNL.  Pt denies any abd/vag cramping, vaginal bleeding, vaginal discnarge. Will also obtain repeat 1 hr glucola. Counseled pt on signs and sxs of Pre-X as well as PTL. Pt instructed that if signs or symptoms persist to return to [facility] for evalaution.  Orders: Glucose 1 hr-[facility] (28413) Comp Met-[facility] (24401-02725) Urinalysis-[facility] (00000) CBC-[facility] (36644) [facility]- Est Level  3 (03474)  Complete Medication List: 1)  Prenatal Vitamins 0.8 Mg Tabs (Prenatal multivit-min-fe-fa) .... One tab by mouth daily 2)  Tamiflu 75 Mg Caps (Oseltamivir phosphate) .... One by mouth two times a day x 5 days 3)  Pseudoephedrine Hcl 30 Mg Tabs (Pseudoephedrine hcl) .... One by mouth every 6 hours as  needed for congestion 4)  Keflex 500 Mg Caps (Cephalexin) .... Take 1 tab by mouth twice a day for 7 days   OB Initial Intake Information    Race: Black    Marital status: Single    Occupation: student  FOB Information    Husband/Father of baby: Karren Burly    FOB occupation unemployed    Phone: no phone  Menstrual History    LMP (date): 03/24/2009    LMP - Character: normal    Menarche: 10 years    Menses interval: 28 days    Menstrual flow 6-7 days    On BCP's at conception: no    Date of positive (+) home preg. test: 05/22/2009   Flowsheet View for Follow-up Visit    Estimated weeks of       gestation:     27 5/7    Weight:     291    Blood pressure:   112 / 57    Urine Protein:     trace    Urine Glucose:   negative    Urine Nitrite:     negative    Headache:     few    Nausea/vomiting:   No    Edema:     TrLE    Vaginal bleeding:   no    Vaginal discharge:   no  Fundal height:      29.0    FHR:       140s    Fetal activity:     yes    Labor symptoms:   no   Laboratory Results   Urine Tests  Date/Time Received: Oct 11, 2009 3:00 PM  Date/Time Reported: Oct 11, 2009 3:16 PM   Routine Urinalysis   Color: yellow Appearance: Clear Glucose: negative   (Normal Range: Negative) Bilirubin: negative   (Normal Range: Negative) Ketone: negative   (Normal Range: Negative) Spec. Gravity: >=1.030   (Normal Range: 1.003-1.035) Blood: trace-intact   (Normal Range: Negative) pH: 6.0   (Normal Range: 5.0-8.0) Protein: trace   (Normal Range: Negative) Urobilinogen: 0.2   (Normal Range: 0-1) Nitrite: negative   (Normal Range: Negative) Leukocyte Esterace: small   (Normal Range: Negative)  Urine Microscopic WBC/HPF: 20+ RBC/HPF: 0-3 Bacteria/HPF: 2+ Mucous/HPF: 1+ Epithelial/HPF: 10-20 Crystals/HPF: occ talc Other: few trich    Comments: ...........test performed by...........Marland KitchenTerese Door, CMA

## 2010-07-08 NOTE — Assessment & Plan Note (Signed)
Summary: ob visit,tcb   Vital Signs:  Patient profile:   20 year old female Weight:      304.6 pounds BP sitting:   120 / 81  Primary Care Provider:  [name] Moore   History of Present Illness: 20 YO G1P0 here at 44 4/7 weeks for routine OB visit. No acute complaints per pt. Nervous about the labor process, but excited the baby. Good social support per pt. Plans on breastfeeding. Wants mirena for birth control.No SI/HI.See flowsheet for full details.     Habits & Providers  Alcohol-Tobacco-Diet     Cigarette Packs/Day: n/a  Allergies: No Known Drug Allergies  Physical Exam  General:  alert and overweight-appearing.   Head:  normocephalic and atraumatic.   Eyes:  vision grossly intact.   Ears:  R ear normal and L ear normal.   Nose:  no external deformity.   Mouth:  good dentition.   Neck:  supple and full ROM.   Lungs:  normal respiratory effort, normal breath sounds, no crackles, and no wheezes.   Heart:  normal rate, regular rhythm, and no murmur.   Abdomen:  gravid abdomen-fundal height greater than gestational age but consistent w/ prior exams.  FHT-43.0 Fetal Heart Rate: 120s-130s Extremities:  2+ edema bilaterally   Neurologic:  alert & oriented X3 and cranial nerves II-XII intact.     Impression & Recommendations:  Problem # 1:  SUPERVISION OF NORMAL FIRST PREGNANCY (ICD-V22.0) Assessment Unchanged Pt tentatively scheduled for induction 7/31. NST and BPP done at [facility]7/25/11 WNL.  Labor red flags re-reviewed w/ pt.  Plan for labor induction 7/31 at [facility]. Will also assess urine for proteinuria or glucosuria.  Orders: UA Glucose/Protein-[facility] (81002) [facility]- Est Level  3 (16109)  Complete Medication List: 1)  Prenatal Vitamins 0.8 Mg Tabs (Prenatal multivit-min-fe-fa) .... One tab by mouth daily 2)  Tamiflu 75 Mg Caps (Oseltamivir phosphate) .... One by mouth two times a day x 5 days 3)  Pseudoephedrine Hcl 30 Mg Tabs (Pseudoephedrine hcl) .... One by mouth every 6  hours as needed for congestion 4)  Keflex 500 Mg Caps (Cephalexin) .... Take 1 tab by mouth twice a day for 7 days  Patient Instructions: 1)   It was good to see you today.  2)   If you have any labor type symptoms (Persistent severe lower abdominal cramping <10 mins apart, large leakage of fluid), go to the [facility] ED to be evaluated.  3)   Schedule an appt to see me in 1 week 4)   If you have any questions, please call the [facility] at [phone] 5)   When it is time for labor, please have the nurses page me at [phone] (Dr. Alvester Morin) 6)   God Bless, 7)   Joan Moore   OB Initial Intake Information    Race: Black    Marital status: Single    Occupation: student  FOB Information    Husband/Father of baby: Karren Burly    FOB occupation unemployed    Phone: no phone  Menstrual History    LMP (date): 03/24/2009    LMP - Character: normal    Menarche: 10 years    Menses interval: 28 days    Menstrual flow 6-7 days    On BCP's at conception: no    Date of positive (+) home preg. test: 05/22/2009   Flowsheet View for Follow-up Visit    Estimated weeks of       gestation:  39 4/7    Weight:     304.6    Blood pressure:   120 / 81    Urine Protein:     negative    Urine Glucose:   negative    Headache:     No    Nausea/vomiting:   nausea    Edema:     2+LE    Vaginal bleeding:   no    Vaginal discharge:   no    Fundal height:      43.o     FHR:       130s-140s    Fetal activity:     yes    Labor symptoms:   few ctx    Taking prenatal vits?   Y    Smoking:     n/a    Next visit:     1 wk    Resident:     SN    Preceptor:     outside preceptor  Laboratory Results   Urine Tests  Date/Time Received: January 02, 2010 2:20 PM  Date/Time Reported: January 02, 2010 2:30 PM   Routine Urinalysis   Glucose: negative   (Normal Range: Negative) Protein: negative   (Normal Range: Negative)    Comments: ...........test performed by...........Marland KitchenTerese Door,  CMA

## 2010-07-10 NOTE — Assessment & Plan Note (Signed)
Summary: f/u ed visit/pcp booked/eo   Allergies: No Known Drug Allergies   Complete Medication List: 1)  Prenatal Vitamins 0.8 Mg Tabs (Prenatal multivit-min-fe-fa) .... One tab by mouth daily 2)  Miralax Powd (Polyethylene glycol 3350) .Marland KitchenMarland Kitchen. 17 gm in 8 ozs of water/juice daily 3)  Ibuprofen 800 Mg Tabs (Ibuprofen) .... Take one at the onset of the headache and may repeat three times a day as needed, do not take more than 3 in 24 hours. 4)  Sumatriptan Succinate 100 Mg Tabs (Sumatriptan succinate) .... One at one at the onset of the ha, may repeat in 1 hour if needed, do not take more than 2 in 24 hours. 5)  Cephalexin 500 Mg Caps (Cephalexin) .Marland Kitchen.. 1 by mouth two times a day x 7 days 6)  Hydrochlorothiazide 25 Mg Tabs (Hydrochlorothiazide) 7)  Abilify 30 Mg Tabs (Aripiprazole) .... Per psych     Laboratory Results   Urine Tests  Date/Time Received: July 01, 2010 4:08 PM  Date/Time Reported: July 01, 2010 5:08 PM   Routine Urinalysis   Color: yellow Appearance: Cloudy Glucose: negative   (Normal Range: Negative) Bilirubin: negative   (Normal Range: Negative) Ketone: negative   (Normal Range: Negative) Spec. Gravity: 1.010   (Normal Range: 1.003-1.035) Blood: negative   (Normal Range: Negative) pH: 7.0   (Normal Range: 5.0-8.0) Protein: negative   (Normal Range: Negative) Urobilinogen: 0.2   (Normal Range: 0-1) Nitrite: negative   (Normal Range: Negative) Leukocyte Esterace: trace   (Normal Range: Negative)  Urine Microscopic WBC/HPF: 1-5 RBC/HPF: 0-3 Bacteria/HPF: 2+ Epithelial/HPF: 10-20    Comments: urine sent for culture ...............test performed by......Marland KitchenBonnie A. [place], MLS (ASCP)cm

## 2010-07-10 NOTE — Assessment & Plan Note (Signed)
Summary: f/up elevated creatnine/ts   Vital Signs:  Patient profile:   20 year old female Height:      65 inches Weight:      280 pounds Pulse rate:   92 / minute BP sitting:   144 / 88  (left arm) Cuff size:   large  Vitals Entered By: Arlyss Repress CMA, (July 01, 2010 3:27 PM) CC: f/up ED. elevated creatnine. neg ct scan. Is Patient Diabetic? No Pain Assessment Patient in pain? no        Primary Care Provider:  [name] MD  CC:  f/up ED. elevated creatnine. neg ct scan.Marland Kitchen  History of Present Illness: Ms Macaulay presents to clinic today as per f/u from ED for acute kidney injury.   Starting 2 weeks ago Ms Steinhart had abdominal pain and went to ED. She was diagnosed with a UTI and given 1 week of bactrim. However the pain continued and she presented to the ED.  At that time urine culture showed 15k CFU multiple morphotypes.  A CT scan without contrast did not show a uriter stone not hydronephrosis or any other problems.   The pain has resolved on its own now yielding only occasional urinary frequency. She feels well otherwise. No fevers or chills.   Habits & Providers  Alcohol-Tobacco-Diet     Tobacco Status: quit > 6 months  Current Problems (verified): 1)  Renal Failure, Acute  (ICD-584.9) 2)  Migraine Headache  (ICD-346.90) 3)  Screening For Malignant Neoplasm of The Cervix  (ICD-V76.2) 4)  Absence of Menstruation  (ICD-626.0) 5)  Diabetes Mellitus, Type II, Family Hx  (ICD-V18.0) 6)  Obesity  (ICD-278.00) 7)  Bipolar Depression  (ICD-296.7) 8)  Eczema, Atopic Dermatitis  (ICD-691.8)  Current Medications (verified): 1)  Prenatal Vitamins 0.8 Mg Tabs (Prenatal Multivit-Min-Fe-Fa) .... One Tab By Mouth Daily 2)  Miralax  Powd (Polyethylene Glycol 3350) .Marland KitchenMarland KitchenMarland Kitchen 17 Gm in 8 Ozs of Water/juice Daily 3)  Ibuprofen 800 Mg Tabs (Ibuprofen) .... Take One At The Onset of The Headache and May Repeat Three Times A Day As Needed, Do Not Take More Than 3 in 24 Hours. 4)   Sumatriptan Succinate 100 Mg Tabs (Sumatriptan Succinate) .... One At One At The Onset of The Ha, May Repeat in 1 Hour If Needed, Do Not Take More Than 2 in 24 Hours. 5)  Cephalexin 500 Mg Caps (Cephalexin) .Marland Kitchen.. 1 By Mouth Two Times A Day X 7 Days 6)  Hydrochlorothiazide 25 Mg Tabs (Hydrochlorothiazide) 7)  Abilify 30 Mg Tabs (Aripiprazole) .... Per Psych  Allergies (verified): No Known Drug Allergies  Past History:  Past Medical History: bipolar disorder ARF 06/2010  Social History: Smoking Status:  quit > 6 months  Review of Systems  The patient denies anorexia, fever, and weight loss.    Physical Exam  General:  alert, morbildy obses NAD Lungs:  CTAB, no wheezes, rales, rhoncii Heart:  RRR Abdomen:  Non distended, NABS, soft, nontender, no guarding or rebound.  Obese Extremities:  Non edemetus BL LE   Impression & Recommendations:  Problem # 1:  RENAL FAILURE, ACUTE (ICD-584.9) Assessment New Pt with creatine of 2 1 week ago at ED. No obvious cause at that time.  I suspect that she either had tyreated pylonephritis vs kidney stone that was passed.  Plan today to repeat CMP, and UA, and CBC.  UA shows mild pos.  Will culture and start Keflex course.  Will follow on phone with labs and with PCP.  Red flags reviewed.   Orders: Comp Met-[facility] [phone]) Urinalysis-[facility] (00000) CBC-[facility] (57846) Urine Culture-[facility] ([id number]) [facility]- Est Level  3 (24401)  Complete Medication List: 1)  Prenatal Vitamins 0.8 Mg Tabs (Prenatal multivit-min-fe-fa) .... One tab by mouth daily 2)  Miralax Powd (Polyethylene glycol 3350) .Marland KitchenMarland Kitchen. 17 gm in 8 ozs of water/juice daily 3)  Ibuprofen 800 Mg Tabs (Ibuprofen) .... Take one at the onset of the headache and may repeat three times a day as needed, do not take more than 3 in 24 hours. 4)  Sumatriptan Succinate 100 Mg Tabs (Sumatriptan succinate) .... One at one at the onset of the ha, may repeat in 1 hour if needed, do not take more than 2 in  24 hours. 5)  Cephalexin 500 Mg Caps (Cephalexin) .Marland Kitchen.. 1 by mouth two times a day x 7 days 6)  Hydrochlorothiazide 25 Mg Tabs (Hydrochlorothiazide) 7)  Abilify 30 Mg Tabs (Aripiprazole) .... Per psych  Patient Instructions: 1)  Thank you for seeing me today. 2)  We will let you know about the labs today.  3)  Dr. Alvester Morin will see you on the 27th.  4)  Let us know about the abdominal pain.  5)  If you have chest pain, difficulty breathing, fevers over 102 that does not get better with tylenol please call us or see a doctor.  6)  we will call with the results of your urine culture.  Prescriptions: CEPHALEXIN 500 MG CAPS (CEPHALEXIN) 1 by mouth two times a day x 7 days  #14 x 0   Entered and Authorized by:   Clementeen Graham MD   Signed by:   Clementeen Graham MD on 07/01/2010   Method used:   Electronically to        [facility] [phone]* (retail)       [address]       [place], [place]  [address]       Ph: [phone]       Fax: [phone]   RxID:   [id number]    Orders Added: 1)  Comp Met-[facility] [[id number]] 2)  Urinalysis-[facility] [00000] 3)  CBC-[facility] [85027] 4)  Urine Culture-[facility] [[id number]] 5)  [facility]- Est Level  3 [23762]

## 2010-07-10 NOTE — Assessment & Plan Note (Signed)
Summary: migraine since last thu/eo   Vital Signs:  Patient profile:   20 year old female Height:      65 inches Weight:      278 pounds BMI:     46.43 Temp:     98.2 degrees F oral Pulse rate:   90 / minute BP sitting:   126 / 91  (right arm) Cuff size:   large  Vitals Entered By: Jimmy Footman, CMA (June 16, 2010 2:39 PM) CC: migrane x5 days Is Patient Diabetic? No   Primary Care Provider:  [name] MD  CC:  migrane x5 days.  History of Present Illness: Reports day 5 of a migrane headache.  She is not a migrane sufferer but family members told her that is what it is.  It is left sided, she has some visual bluring, it increases in intensity, today less intense.  She started her period 2 days after the onset of the headache.  Delivered first baby by C-section 3 months ago.  No on contraception.  Habits & Providers  Alcohol-Tobacco-Diet     Tobacco Status: never     Cigarette Packs/Day: n/a  Current Medications (verified): 1)  Prenatal Vitamins 0.8 Mg Tabs (Prenatal Multivit-Min-Fe-Fa) .... One Tab By Mouth Daily 2)  Miralax  Powd (Polyethylene Glycol 3350) .Marland KitchenMarland KitchenMarland Kitchen 17 Gm in 8 Ozs of Water/juice Daily 3)  Ibuprofen 800 Mg Tabs (Ibuprofen) .... Take One At The Onset of The Headache and May Repeat Three Times A Day As Needed, Do Not Take More Than 3 in 24 Hours. 4)  Sumatriptan Succinate 100 Mg Tabs (Sumatriptan Succinate) .... One At One At The Onset of The Ha, May Repeat in 1 Hour If Needed, Do Not Take More Than 2 in 24 Hours.  Allergies (verified): No Known Drug Allergies  Physical Exam  General:  alert, morbildy obses Eyes:  vision grossly intact, pupils equal, pupils round, pupils reactive to light, and no optic disk abnormalities.   Neurologic:  cranial nerves II-XII intact, strength normal in all extremities, and gait normal.   Psych:  normally interactive.     Impression & Recommendations:  Problem # 1:  MIGRAINE HEADACHE (ICD-346.90) Not a migrane  sufferer by history but symptoms are classic.  Treat with NSAIDS, trial of a triptan for recurrance, patient was very bright and able to learn the difference between meds.She will follow up with her primary MD in 6 weeks and keep a headache log of events and use of meds. Her updated medication list for this problem includes:    Ibuprofen 800 Mg Tabs (Ibuprofen) .Marland Kitchen... Take one at the onset of the headache and may repeat three times a day as needed, do not take more than 3 in 24 hours.    Sumatriptan Succinate 100 Mg Tabs (Sumatriptan succinate) ..... One at one at the onset of the ha, may repeat in 1 hour if needed, do not take more than 2 in 24 hours.  Orders: [facility]- Est Level  3 (81191) Ketorolac-Toradol 15mg  (Y7829)  Complete Medication List: 1)  Prenatal Vitamins 0.8 Mg Tabs (Prenatal multivit-min-fe-fa) .... One tab by mouth daily 2)  Miralax Powd (Polyethylene glycol 3350) .Marland KitchenMarland Kitchen. 17 gm in 8 ozs of water/juice daily 3)  Ibuprofen 800 Mg Tabs (Ibuprofen) .... Take one at the onset of the headache and may repeat three times a day as needed, do not take more than 3 in 24 hours. 4)  Sumatriptan Succinate 100 Mg Tabs (Sumatriptan succinate) .... One at  one at the onset of the ha, may repeat in 1 hour if needed, do not take more than 2 in 24 hours.  Patient Instructions: 1)  SInce this is you first migrane, will treat this one with high dose ibuprofen. (injection of Torodol in office) 2)  Sumatriptan or Imitrex is a med to abort the migrane, if you have another take this as soon as you feel it coming on and get in a quiet space for 15-20 minutes, may repeat in one hour 3)  Keep track of the HA, write it down so you can see patterns 4)  Follow up with Dr. Alvester Morin in 6 weeks Prescriptions: SUMATRIPTAN SUCCINATE 100 MG TABS (SUMATRIPTAN SUCCINATE) one at one at the onset of the HA, may repeat in 1 hour if needed, do not take more than 2 in 24 hours.  #10 x 0   Entered and Authorized by:   Luretha Murphy  NP   Signed by:   Luretha Murphy NP on 06/16/2010   Method used:   Electronically to        [facility] [phone]* (retail)       [address]       [place], [place]  [address]       Ph: [phone]       Fax: [phone]   RxID:   [id number] IBUPROFEN 800 MG TABS (IBUPROFEN) take one at the onset of the headache and may repeat three times a day as needed, do not take more than 3 in 24 hours.  #50 x 3   Entered and Authorized by:   Luretha Murphy NP   Signed by:   Luretha Murphy NP on 06/16/2010   Method used:   Electronically to        [facility] [phone]* (retail)       [address]       [place], [place]  [address]       Ph: [phone]       Fax: [phone]   RxID:   [id number]    Medication Administration  Injection # 1:    Medication: Ketorolac-Toradol 15mg     Diagnosis: MIGRAINE HEADACHE (ICD-346.90)    Route: IM    Site: RUOQ gluteus    Exp Date: 11/07/2011    Lot #: [id number]    Mfr: novaplus    Patient tolerated injection without complications    Given by: Jimmy Footman, CMA (June 16, 2010 3:43 PM)  Orders Added: 1)  [facility]- Est Level  3 [99213] 2)  Ketorolac-Toradol 15mg  [X3235]

## 2010-07-10 NOTE — Progress Notes (Signed)
  Phone Note Outgoing Call   Call placed by: Clementeen Graham MD,  July 04, 2010 8:35 AM Summary of Call: Called to inform her about the urine culture results. Left a mssage. Told to stop Keflex.

## 2010-07-19 ENCOUNTER — Inpatient Hospital Stay (INDEPENDENT_AMBULATORY_CARE_PROVIDER_SITE_OTHER)
Admission: RE | Admit: 2010-07-19 | Discharge: 2010-07-19 | Disposition: A | Discharge status: Home / Self Care | Payer: 59 | Source: Ambulatory Visit | Attending: Family Medicine | Admitting: Family Medicine

## 2010-07-19 DIAGNOSIS — K5289 Other specified noninfective gastroenteritis and colitis: Secondary | ICD-10-CM

## 2010-07-19 LAB — POCT URINALYSIS DIPSTICK
Bilirubin Urine: NEGATIVE
Nitrite: NEGATIVE
Urine Glucose, Fasting: NEGATIVE mg/dL

## 2010-07-19 LAB — POCT I-STAT, CHEM 8
Glucose, Bld: 90 mg/dL (ref 70–99)
HCT: 41 % (ref 36.0–46.0)
Hemoglobin: 13.9 g/dL (ref 12.0–15.0)
Potassium: 3.9 mEq/L (ref 3.5–5.1)
Sodium: 141 mEq/L (ref 135–145)

## 2010-08-04 ENCOUNTER — Ambulatory Visit: Payer: Self-pay | Admitting: Family Medicine

## 2010-08-21 LAB — POCT URINALYSIS DIPSTICK
Bilirubin Urine: NEGATIVE
Ketones, ur: NEGATIVE mg/dL
Protein, ur: 30 mg/dL — AB
Urobilinogen, UA: 1 mg/dL (ref 0.0–1.0)
pH: 5.5 (ref 5.0–8.0)

## 2010-08-21 LAB — POCT PREGNANCY, URINE: Preg Test, Ur: NEGATIVE

## 2010-08-22 ENCOUNTER — Ambulatory Visit (INDEPENDENT_AMBULATORY_CARE_PROVIDER_SITE_OTHER): Payer: 59 | Admitting: Family Medicine

## 2010-08-22 ENCOUNTER — Other Ambulatory Visit (HOSPITAL_COMMUNITY)
Admission: RE | Admit: 2010-08-22 | Discharge: 2010-08-22 | Disposition: A | Discharge status: Home / Self Care | Payer: 59 | Source: Ambulatory Visit | Attending: Family Medicine | Admitting: Family Medicine

## 2010-08-22 ENCOUNTER — Encounter: Payer: Self-pay | Admitting: Family Medicine

## 2010-08-22 ENCOUNTER — Other Ambulatory Visit: Payer: Self-pay | Admitting: Family Medicine

## 2010-08-22 DIAGNOSIS — G43909 Migraine, unspecified, not intractable, without status migrainosus: Secondary | ICD-10-CM

## 2010-08-22 DIAGNOSIS — Z309 Encounter for contraceptive management, unspecified: Secondary | ICD-10-CM

## 2010-08-22 DIAGNOSIS — E669 Obesity, unspecified: Secondary | ICD-10-CM

## 2010-08-22 DIAGNOSIS — Z13220 Encounter for screening for lipoid disorders: Secondary | ICD-10-CM

## 2010-08-22 DIAGNOSIS — N898 Other specified noninflammatory disorders of vagina: Secondary | ICD-10-CM

## 2010-08-22 DIAGNOSIS — Z124 Encounter for screening for malignant neoplasm of cervix: Secondary | ICD-10-CM

## 2010-08-22 DIAGNOSIS — N179 Acute kidney failure, unspecified: Secondary | ICD-10-CM

## 2010-08-22 DIAGNOSIS — F319 Bipolar disorder, unspecified: Secondary | ICD-10-CM

## 2010-08-22 DIAGNOSIS — Z01419 Encounter for gynecological examination (general) (routine) without abnormal findings: Secondary | ICD-10-CM | POA: Insufficient documentation

## 2010-08-22 LAB — COMPREHENSIVE METABOLIC PANEL
ALT: 43 U/L — ABNORMAL HIGH (ref 0–35)
ALT: 48 U/L — ABNORMAL HIGH (ref 0–35)
AST: 24 U/L (ref 0–37)
AST: 29 U/L (ref 0–37)
Albumin: 2.6 g/dL — ABNORMAL LOW (ref 3.5–5.2)
Albumin: 2.6 g/dL — ABNORMAL LOW (ref 3.5–5.2)
Alkaline Phosphatase: 101 U/L (ref 39–117)
Alkaline Phosphatase: 52 U/L (ref 39–117)
BUN: 10 mg/dL (ref 6–23)
CO2: 23 mEq/L (ref 19–32)
CO2: 22 mEq/L (ref 19–32)
Calcium: 7.5 mg/dL — ABNORMAL LOW (ref 8.4–10.5)
Chloride: 109 mEq/L (ref 96–112)
Creat: 0.63 mg/dL (ref 0.40–1.20)
GFR calc Af Amer: >60 mL/min (ref >60)
GFR calc Af Amer: >60 mL/min (ref >60)
GFR calc non Af Amer: >60 mL/min (ref >60)
Glucose, Bld: 87 mg/dL (ref 70–99)
Glucose, Bld: 102 mg/dL — ABNORMAL HIGH (ref 70–99)
Potassium: 3.2 mEq/L — ABNORMAL LOW (ref 3.5–5.1)
Potassium: 3.6 mEq/L (ref 3.5–5.1)
Sodium: 139 mEq/L (ref 135–145)
Sodium: 140 mEq/L (ref 135–145)
Sodium: 138 mEq/L (ref 135–145)
Total Bilirubin: 0.6 mg/dL (ref 0.3–1.2)
Total Bilirubin: 0.2 mg/dL — ABNORMAL LOW (ref 0.3–1.2)
Total Protein: 6.1 g/dL (ref 6.0–8.3)
Total Protein: 6.7 g/dL (ref 6.0–8.3)

## 2010-08-22 LAB — URINALYSIS, ROUTINE W REFLEX MICROSCOPIC
Glucose, UA: NEGATIVE mg/dL
Hgb urine dipstick: NEGATIVE
Specific Gravity, Urine: >1.03 — ABNORMAL HIGH (ref 1.005–1.030)
Urobilinogen, UA: 1 mg/dL (ref 0.0–1.0)

## 2010-08-22 LAB — CBC
HCT: 32.7 % — ABNORMAL LOW (ref 36.0–46.0)
Hemoglobin: 10.2 g/dL — ABNORMAL LOW (ref 12.0–15.0)
Hemoglobin: 10.5 g/dL — ABNORMAL LOW (ref 12.0–15.0)
MCHC: 33.2 g/dL (ref 30.0–36.0)
MCHC: 33.6 g/dL (ref 30.0–36.0)
MCHC: 33.7 g/dL (ref 30.0–36.0)
MCV: 85.9 fL (ref 78.0–100.0)
Platelets: 178 10*3/uL (ref 150–400)
Platelets: 195 10*3/uL (ref 150–400)
Platelets: 287 10*3/uL (ref 150–400)
Platelets: 305 10*3/uL (ref 150–400)
RBC: 3.61 MIL/uL — ABNORMAL LOW (ref 3.87–5.11)
RDW: 15 % (ref 11.5–15.5)
RDW: 15.4 % (ref 11.5–15.5)
RDW: 15.7 % — ABNORMAL HIGH (ref 11.5–15.5)
WBC: 11.5 10*3/uL — ABNORMAL HIGH (ref 4.0–10.5)
WBC: 9.3 10*3/uL (ref 4.0–10.5)
WBC: 9.8 10*3/uL (ref 4.0–10.5)

## 2010-08-22 LAB — BLOOD GAS, ARTERIAL
Acid-base deficit: 3 mmol/L — ABNORMAL HIGH (ref 0.0–2.0)
Bicarbonate: 19.6 mEq/L — ABNORMAL LOW (ref 20.0–24.0)
Drawn by: 153
TCO2: 20.5 mmol/L (ref 0–100)

## 2010-08-22 LAB — CONVERTED CEMR LAB
Albumin: 3.8 g/dL (ref 3.5–5.2)
Alkaline Phosphatase: 52 units/L (ref 39–117)
CO2: 22 meq/L (ref 19–32)
Calcium: 8.3 mg/dL — ABNORMAL LOW (ref 8.4–10.5)
Chloride: 108 meq/L (ref 96–112)
Cholesterol: 86 mg/dL (ref 0–200)
GC Probe Amp, Genital: NEGATIVE
Glucose, Bld: 102 mg/dL — ABNORMAL HIGH (ref 70–99)
LDL Cholesterol: 28 mg/dL (ref 0–99)
Potassium: 3.9 meq/L (ref 3.5–5.3)
Sodium: 138 meq/L (ref 135–145)
Total Protein: 6.7 g/dL (ref 6.0–8.3)
Triglycerides: 84 mg/dL (ref <150)

## 2010-08-22 LAB — MRSA CULTURE

## 2010-08-22 LAB — WOUND CULTURE

## 2010-08-22 LAB — LIPID PANEL
Cholesterol: 86 mg/dL (ref 0–200)
HDL: 41 mg/dL (ref >39)
LDL Cholesterol: 28 mg/dL (ref 0–99)
Triglycerides: 84 mg/dL (ref <150)
VLDL: 17 mg/dL (ref 0–40)

## 2010-08-22 LAB — URINE MICROSCOPIC-ADD ON

## 2010-08-22 LAB — MRSA PCR SCREENING: MRSA by PCR: NEGATIVE

## 2010-08-22 LAB — POCT WET PREP (WET MOUNT): Trichomonas Wet Prep HPF POC: NEGATIVE

## 2010-08-22 MED ORDER — MEDROXYPROGESTERONE ACETATE 150 MG/ML IM SUSP
150.0000 mg | Freq: Once | INTRAMUSCULAR | Status: AC
  Administered 2010-08-22: 150 mg via INTRAMUSCULAR

## 2010-08-22 NOTE — Progress Notes (Signed)
  Subjective:    Patient ID: Joan Moore, female    DOB: 1990/08/11, 20 y.o.   MRN: [id number]  HPI Pt here for ED follow up for kidney stone. Pt was seen in ED 2 months ago for severe abdominal/flank pain and hematuria. Was also noted to be in ARF with Cr around 2.0  likely secondary to blocked ureter in setting of kidney stone. Pt states that she since passed kidney stone. Pt denies hematuria, dysuria, abdominal pain, nausea, vomiting,or urinary frequency since passing of kidney stone. Pt denies hx/o kidney stone in the past.    Pt also here to discuss birth control and vaginal discharge:  is interested in mirena. Sexually active. Unprotected. Most recent activity 2 weeks ago. Currently not on any contraception. Has had vaginal discharge x 1 week per pt. Non foul smelling.   Migraine: Pt seen 06/16/10 for migraine. Was given toradol and NSAIDs as well as prn triptan for breathrough headache pain. Pt denies any headache since clinic visit. Has not had to use any of triptan rx per pt.   HTN: Pt w/ noted elevated BP in the past. Was previously on HCTZ. Pt states that she has not taken this medication for >2 months. Pt states that she has not been checking her blood pressures at home per pt. No HA, vision change, CP, dyspnea, edema per pt.   Mood: Pt states that mood is stable. Currently on abilify 30 per pt. Pt feels that mood is sometimes down. However, no HI/SI. Had followup with [facility] 1-2 weeks ago with titration of medication to higher dose. Pt states that she has not taken higher dose. Pt unsure of what higher dose is per pt.  Review of Systems See hpi    Objective:   Physical Exam Gen: up in chair, NAD, mornidly obese.  HEENT: NCAT, EOMI, TMs clear bilaterally CV: RRR, no murmurs auscultated PULM: CTAB, no wheezes, rales, rhoncii ABD: S/NT/obese/+ bowel sounds  GU: + vaginal discharge. No visible external lesions.  EXT: 2+ peripheral pulses          Assessment &  Plan:  18 YOF with recent kidney stone here with vaginal discharge and elevated BP Kidney Stones: clinically resolved per pt. Discussed adequate hydration.  Pt agreeable.   Vaginal Discharge: Wet prep, GC/Chl. Will treat pending results.   HTN: Will start back on HCTZ. Will check Cr and K  Obesity: pt with noted morbid obesity. Will check lipid panel as well as TSH. Pt is agreeable to nutritional counseling.  There is some concern for PCOS. However, pt with normal menses and pt is post partum (no hx/o irregular menses). More likely with metabolic syndrome. Will follow pending labs.   Migraine: Symptomatically resolved.   Mood: Currently stable. No HI/SI. Will defer to psychiatry.   Contraception: Upreg negative. Depo today. Likely mirena in 3 months.

## 2010-08-22 NOTE — Patient Instructions (Addendum)
Hypertension (High Blood Pressure) As your heart beats, it forces blood through your arteries. This force is your blood pressure. If the pressure is too high, it is called hypertension (HTN) or high blood pressure. HTN is dangerous because you may have it and not know it. High blood pressure may mean that your heart has to work harder to pump blood. Your arteries may be narrow or stiff. The extra work puts you at risk for heart disease, stroke, and other problems.  Blood pressure consists of two numbers, a higher number over a lower, 110/72, for example. It is stated as "110 over 72." The ideal is below 120 for the top number (systolic) and under 80 for the bottom (diastolic). Write down your blood pressure today. You should pay close attention to your blood pressure if you have certain conditions such as:  Heart failure.  Prior heart attack.   Diabetes   Chronic kidney disease.   Prior stroke.   Multiple risk factors for heart disease.   To see if you have HTN, your blood pressure should be measured while you are seated with your arm held at the level of the heart. It should be measured at least twice. A one-time elevated blood pressure reading (especially in the Emergency Department) does not mean that you need treatment. There may be conditions in which the blood pressure is different between your right and left arms. It is important to see your caregiver soon for a recheck. Most people have essential hypertension which means that there is not a specific cause. This type of high blood pressure may be lowered by changing lifestyle factors such as:  Stress.  Smoking.   Lack of exercise.   Excessive weight.  Drug/tobacco/alcohol use.   Eating less salt.   Most people do not have symptoms from high blood pressure until it has caused damage to the body. Effective treatment can often prevent, delay or reduce that damage. TREATMENT Treatment for high blood pressure, when a cause has been  identified, is directed at the cause. There are a large number of medications to treat HTN. These fall into several categories, and your caregiver will help you select the medicines that are best for you. Medications may have side effects. You should review side effects with your caregiver. If your blood pressure stays high after you have made lifestyle changes or started on medicines,   Your medication(s) may need to be changed.   Other problems may need to be addressed.   Be certain you understand your prescriptions, and know how and when to take your medicine.   Be sure to follow up with your caregiver within the time frame advised (usually within two weeks) to have your blood pressure rechecked and to review your medications.   If you are taking more than one medicine to lower your blood pressure, make sure you know how and at what times they should be taken. Taking two medicines at the same time can result in blood pressure that is too low.  SEEK IMMEDIATE MEDICAL CARE IF YOU DEVELOP:  A severe headache, blurred or changing vision, or confusion.   Unusual weakness or numbness, or a faint feeling.   Severe chest or abdominal pain, vomiting, or breathing problems.  MAKE SURE YOU:   Understand these instructions.   Will watch your condition.   Will get help right away if you are not doing well or get worse.  Document Released: 05/25/2005 Document Re-Released: 11/12/2009 [employer] Patient Information 2011 [employer].Kidney Stones Kidney stones (ureteral lithiasis) are deposits that form inside your kidneys. The intense pain is caused by the stone moving through the urinary tract. When the stone moves, the ureter goes into spasm around the stone. The stone is usually passed in the urine.  CAUSES  A disorder that makes certain neck glands produce too much parathyroid hormone (primary hyperparathyroidism).   A buildup of uric acid crystals.   Narrowing (stricture) of the ureter.     A kidney obstruction present at birth (congenital obstruction).   Previous surgery on the kidney or ureters.   Numerous kidney infections.  SYMPTOMS  Feeling sick to your stomach (nauseous).   Throwing up (vomiting).   Blood in the urine (hematuria).   Pain that usually spreads (radiates) to the groin.   Frequency or urgency of urination.  DIAGNOSIS  Taking a history and physical exam.   Blood or urine tests.   Computerized X-ray scan (CT scan).   Occasionally, an examination of the inside of the urinary bladder (cystoscopy) is performed.  TREATMENT  Observation.   Increasing your fluid intake.   Surgery may be needed if you have severe pain or persistent obstruction.  The size, location, and chemical composition are all important variables that will determine the proper choice of action for you. Talk to your caregiver to better understand your situation so that you will minimize the risk of injury to yourself and your kidney.  HOME CARE INSTRUCTIONS  Drink enough water and fluids to keep your urine clear or pale yellow.   Strain all urine through the provided strainer. Keep all particulate matter and stones for your caregiver to see. The stone causing the pain may be as small as a grain of salt. It is very important to use the strainer each and every time you pass your urine. The collection of your stone will allow your caregiver to analyze it and verify that a stone has actually passed.   Only take over-the-counter or prescription medicines for pain, discomfort, or fever as directed by your caregiver.   Make a follow-up appointment with your caregiver as directed.   Get follow-up X-rays if required. The absence of pain does not always mean that the stone has passed. It may have only stopped moving. If the urine remains completely obstructed, it can cause loss of kidney function or even complete destruction of the kidney. It is your responsibility to make sure X-rays  and follow-ups are completed. Ultrasounds of the kidney can show blockages and the status of the kidney. Ultrasounds are not associated with any radiation and can be performed easily in a matter of minutes.  SEEK IMMEDIATE MEDICAL CARE IF:  Pain cannot be controlled with the prescribed medicine.   You have an oral temperature above 100.5, not controlled by medicine.   The severity or intensity of pain increases over 18 hours and is not relieved by pain medicine.   You develop a new onset of belly (abdominal) pain.   You feel faint or pass out.  MAKE SURE YOU:  Understand these instructions.   Will watch your condition.   Will get help right away if you are not doing well or get worse.  Document Released: 05/25/2005 Document Re-Released: 11/12/2009 [facility] Patient Information 2011 [place], [place].Obesity Obesity is defined as having a Body Mass Index (BMI) of 30 or more. To calculate your BMI divide your weight in pounds by your height in inches squared and multiply that product by 703. Major illnesses resulting  from long-term obesity include:  Stroke.   Heart disease.   Diabetes.   Many cancers.   Arthritis.  Obesity also complicates recovery from many other medical problems.  CAUSES  A history of obesity in your parents.   Thyroid hormone imbalance.   Environmental factors such as excess calorie intake and physical inactivity.  TREATMENT A healthy weight loss program includes:  A calorie restricted diet based on individual calorie needs.   Increased physical activity (exercise).  An exercise program is just as important as the right low calorie diet.  Weight-loss medicines should be used only under the supervision of your physician. These medicines help, but only if they are used with diet and exercise programs. Medicines can have side effects including nervousness, nausea, abdominal pain, diarrhea, headache, drowsiness, and depression.  An unhealthy weight loss  program includes:  Fasting.   Fad diets.   Supplements and drugs.  These choices do not succeed in long-term weight control.  HOME CARE INSTRUCTIONS To help you make the needed dietary changes:   Keep a daily record of everything you eat. There are many free websites to help you with this. It may be helpful to measure your foods so you can determine if you are eating the correct portion sizes.   Use low-calorie cookbooks or take special cooking classes.   Avoid alcohol. Drink more water and drinks with no calories.   Take vitamins and supplements only as recommended by your caregiver.   Weight-loss support groups, Optometrist, counselors, and stress reduction education can also be very helpful.  PREVENTION Losing weight and keeping it off takes time, discipline, a healthy diet and regular exercise. Document Released: 07/02/2004 Document Re-Released: 06/14/2007 [facility] Patient Information 2011 [place], [place].

## 2010-08-23 LAB — COMPREHENSIVE METABOLIC PANEL
ALT: 18 U/L (ref 0–35)
AST: 20 U/L (ref 0–37)
Albumin: 2.7 g/dL — ABNORMAL LOW (ref 3.5–5.2)
CO2: 19 mEq/L (ref 19–32)
Calcium: 8.6 mg/dL (ref 8.4–10.5)
Creatinine, Ser: 0.52 mg/dL (ref 0.4–1.2)
GFR calc Af Amer: >60 mL/min (ref >60)
GFR calc non Af Amer: >60 mL/min (ref >60)
Sodium: 135 mEq/L (ref 135–145)
Total Protein: 6 g/dL (ref 6.0–8.3)

## 2010-08-23 LAB — URINALYSIS, ROUTINE W REFLEX MICROSCOPIC
Glucose, UA: NEGATIVE mg/dL
Leukocytes, UA: NEGATIVE
Protein, ur: NEGATIVE mg/dL
pH: 6 (ref 5.0–8.0)

## 2010-08-23 LAB — CBC
Hemoglobin: 11.8 g/dL — ABNORMAL LOW (ref 12.0–15.0)
MCH: 28.5 pg (ref 26.0–34.0)
MCV: 85.5 fL (ref 78.0–100.0)
RBC: 4.12 MIL/uL (ref 3.87–5.11)

## 2010-08-23 LAB — GC/CHLAMYDIA PROBE AMP, GENITAL
Chlamydia, DNA Probe: NEGATIVE
GC Probe Amp, Genital: NEGATIVE

## 2010-08-23 LAB — URINE MICROSCOPIC-ADD ON

## 2010-08-24 ENCOUNTER — Encounter: Payer: Self-pay | Admitting: Family Medicine

## 2010-08-24 DIAGNOSIS — Z309 Encounter for contraceptive management, unspecified: Secondary | ICD-10-CM | POA: Insufficient documentation

## 2010-08-24 DIAGNOSIS — N898 Other specified noninflammatory disorders of vagina: Secondary | ICD-10-CM | POA: Insufficient documentation

## 2010-08-24 NOTE — Assessment & Plan Note (Signed)
Clinically resolved. WIll follow up prn

## 2010-08-24 NOTE — Assessment & Plan Note (Signed)
Upreg negative. Depo today. Likely mirena in 3 months.

## 2010-08-24 NOTE — Assessment & Plan Note (Signed)
BMI >50. Pt with noted morbid obesity. Will check lipid panel as well as TSH. Pt is agreeable to nutritional counseling. Stressed importance on regular exercise and calorie counting.  There is some concern for PCOS. However, pt with normal menses and pt is post partum (no hx/o irregular menses). More likely with metabolic syndrome. Will follow pending labs.

## 2010-08-24 NOTE — Assessment & Plan Note (Addendum)
Discussed safe sex practices. Will check wet prep, GC/Chl.

## 2010-08-24 NOTE — Assessment & Plan Note (Signed)
Sympomatically and clinically resolved from most recent Cr. WIll recheck today.

## 2010-08-24 NOTE — Assessment & Plan Note (Signed)
Previously on abilify 30,g daily with planned increase in dose currently. Overall stable. Will defer to psych.

## 2010-08-26 LAB — GLUCOSE, CAPILLARY: Glucose-Capillary: 92 mg/dL (ref 70–99)

## 2010-08-27 LAB — POCT I-STAT, CHEM 8
Calcium, Ion: 1.14 mmol/L (ref 1.12–1.32)
Glucose, Bld: 96 mg/dL (ref 70–99)
HCT: 37 % (ref 36.0–46.0)
Hemoglobin: 12.6 g/dL (ref 12.0–15.0)
Potassium: 3.8 mEq/L (ref 3.5–5.1)

## 2010-08-27 LAB — URINALYSIS, ROUTINE W REFLEX MICROSCOPIC
Bilirubin Urine: NEGATIVE
Nitrite: NEGATIVE
Specific Gravity, Urine: 1.027 (ref 1.005–1.030)
Urobilinogen, UA: 0.2 mg/dL (ref 0.0–1.0)

## 2010-09-03 ENCOUNTER — Encounter: Payer: Self-pay | Admitting: Family Medicine

## 2010-09-08 ENCOUNTER — Ambulatory Visit: Payer: 59 | Admitting: Family Medicine

## 2010-09-24 ENCOUNTER — Ambulatory Visit: Payer: 59 | Admitting: Family Medicine

## 2010-09-30 ENCOUNTER — Ambulatory Visit: Payer: 59 | Admitting: Family Medicine

## 2010-11-19 ENCOUNTER — Encounter: Payer: Self-pay | Admitting: Family Medicine

## 2010-11-19 ENCOUNTER — Ambulatory Visit (HOSPITAL_COMMUNITY)
Admission: RE | Admit: 2010-11-19 | Discharge: 2010-11-19 | Disposition: A | Discharge status: Home / Self Care | Payer: 59 | Source: Ambulatory Visit | Attending: Cardiology | Admitting: Cardiology

## 2010-11-19 ENCOUNTER — Ambulatory Visit (INDEPENDENT_AMBULATORY_CARE_PROVIDER_SITE_OTHER): Payer: 59 | Admitting: Family Medicine

## 2010-11-19 DIAGNOSIS — F17200 Nicotine dependence, unspecified, uncomplicated: Secondary | ICD-10-CM

## 2010-11-19 DIAGNOSIS — F319 Bipolar disorder, unspecified: Secondary | ICD-10-CM

## 2010-11-19 DIAGNOSIS — I10 Essential (primary) hypertension: Secondary | ICD-10-CM | POA: Insufficient documentation

## 2010-11-19 DIAGNOSIS — I498 Other specified cardiac arrhythmias: Secondary | ICD-10-CM | POA: Insufficient documentation

## 2010-11-19 DIAGNOSIS — Z720 Tobacco use: Secondary | ICD-10-CM

## 2010-11-19 DIAGNOSIS — E669 Obesity, unspecified: Secondary | ICD-10-CM

## 2010-11-19 DIAGNOSIS — Z309 Encounter for contraceptive management, unspecified: Secondary | ICD-10-CM

## 2010-11-19 MED ORDER — MEDROXYPROGESTERONE ACETATE 150 MG/ML IM SUSP: 150.0000 mg | INTRAMUSCULAR | Status: DC

## 2010-11-19 MED ORDER — HYDROXYZINE PAMOATE 25 MG PO CAPS: ORAL_CAPSULE | ORAL | Status: DC

## 2010-11-19 MED ORDER — MEDROXYPROGESTERONE ACETATE 150 MG/ML IM SUSP
150.0000 mg | INTRAMUSCULAR | Status: DC
  Administered 2010-11-19: 150 mg via INTRAMUSCULAR

## 2010-11-19 MED ORDER — BUPROPION HCL 100 MG PO TABS: ORAL_TABLET | ORAL | Status: AC

## 2010-11-19 NOTE — Assessment & Plan Note (Signed)
depo shot. IUD in 3 months.

## 2010-11-19 NOTE — Patient Instructions (Signed)
It was good to see you again I am starting you on wellbutrin for your smoking I want to check and ekg because of your intermittent CP You will get a depo shot today We can place a mirena IUD in 3 months Come back to see me in 2-3 weeks to follow up on your smoking.  Otherwise call with any questions,  God Bless [name] MD

## 2010-11-19 NOTE — Assessment & Plan Note (Signed)
BP stable today. At goal. Will continue with HCTZ. Stressed importance of daily compliance. Stressed importance of daily BP monitoring

## 2010-11-19 NOTE — Assessment & Plan Note (Signed)
Overall stable. Plugged in with psychiatry. Will start pt on wellbutrin for smoking cessation. This may also help with mood.

## 2010-11-19 NOTE — Assessment & Plan Note (Signed)
Pt desiring to continue smoking cessation. Will place on wellbutrin as an adunctive measure. Pt reports poor outcome with nicotine patch. Would like to avoid chantix given baseline psych issues.

## 2010-11-19 NOTE — Progress Notes (Signed)
  Subjective:    Patient ID: Joan Moore, female    DOB: 1991/04/27, 20 y.o.   MRN: [id number]  HPI Mood: Currently on abilify 5 mg and vistoril 25 mg prn anxiety. Psychiatrist Radene Knee. No HI/SI Tobacco; Has stooped smoking for 1 week. Started working. Many coworkers are smoking. Has the urge to start again. Has tried patch in the past; was not successful. BP: Currently on HCTZ 25 mg daily. Has been taking intermittently. Has not checking BPs at home. No HA, SOB. Intermittent chest pains lasting seconds. No active CP.   Curretnly working at telephone center  Review of Systems See HPI     Objective:   Physical Exam Gen: up in chair, NAD, morbidly obese  HEENT: NCAT, EOMI, TMs clear bilaterally; + cerumen  CV: RRR, no murmurs auscultated PULM: CTAB, no wheezes, rales, rhoncii, distant breath sounds secondary to body habitus  ABD: S/NT/+ bowel sounds, obese abdomen EXT: 2+ peripheral pulses    Assessment & Plan:  Mood: Overall stable. Plugged in with psychiatry.  Will start pt on wellbutrin for smoking cessation. This may also help with mood.  HTN: BP stable today. At goal. Will continue with HCTZ. Stressed importance of daily compliance. Stressed importance of daily BP monitoring Tobacco Abuse: Pt desiring to continue smoking cessation. Will place on wellbutrin as an adunctive measure. Pt reports poor outcome with nicotine patch. Would like to avoid chantix given baseline psych issues.  Obesity: BMI 53. Encouraged diet and exercise. Will refer to nutrition.  Contraception: depo shot. IUD in 3 months.

## 2010-11-19 NOTE — Assessment & Plan Note (Signed)
BMI 53. Encouraged diet and exercise. Will refer to nutrition.

## 2010-12-12 ENCOUNTER — Ambulatory Visit: Payer: 59 | Admitting: Family Medicine

## 2010-12-23 ENCOUNTER — Telehealth: Payer: Self-pay | Admitting: Family Medicine

## 2010-12-23 NOTE — Telephone Encounter (Signed)
Needs letter for school stating that she had to stop school last year because of pre-eclampsia(she was on bed rest) and she can't get back in school without a letter.  Needs this letter this week.

## 2010-12-23 NOTE — Telephone Encounter (Signed)
Will send request to PCP.

## 2010-12-29 NOTE — Telephone Encounter (Signed)
Called pt. Pt said, that she needs the letter by Thursday. I told the pt, that I would ask Dr.Newton and we will call her back, once we hear from Dr.N. Pt agreed. Fwd. To Dr.Newton .Arlyss Repress

## 2010-12-29 NOTE — Telephone Encounter (Signed)
Joan Moore has called back again about the letter.

## 2010-12-30 ENCOUNTER — Encounter: Payer: Self-pay | Admitting: Family Medicine

## 2010-12-30 NOTE — Telephone Encounter (Signed)
Pt needs this asap - this encounter was closed so I am not sure if Dr Alvester Morin rec'd this notice.

## 2010-12-31 ENCOUNTER — Ambulatory Visit: Payer: 59 | Admitting: Family Medicine

## 2011-03-13 LAB — RAPID STREP SCREEN (MED CTR MEBANE ONLY): Streptococcus, Group A Screen (Direct): NEGATIVE

## 2011-03-23 ENCOUNTER — Ambulatory Visit: Payer: 59 | Admitting: Family Medicine

## 2011-03-27 ENCOUNTER — Ambulatory Visit: Payer: 59 | Admitting: Family Medicine

## 2011-04-15 ENCOUNTER — Ambulatory Visit (INDEPENDENT_AMBULATORY_CARE_PROVIDER_SITE_OTHER): Payer: 59 | Admitting: Family Medicine

## 2011-04-15 ENCOUNTER — Encounter: Payer: Self-pay | Admitting: Family Medicine

## 2011-04-15 VITALS — BP 153/92 | HR 87 | Temp 98.8°F | Ht 62.0 in | Wt 294.0 lb

## 2011-04-15 DIAGNOSIS — E669 Obesity, unspecified: Secondary | ICD-10-CM

## 2011-04-15 DIAGNOSIS — I10 Essential (primary) hypertension: Secondary | ICD-10-CM

## 2011-04-15 DIAGNOSIS — L83 Acanthosis nigricans: Secondary | ICD-10-CM

## 2011-04-15 DIAGNOSIS — M79609 Pain in unspecified limb: Secondary | ICD-10-CM

## 2011-04-15 DIAGNOSIS — Z23 Encounter for immunization: Secondary | ICD-10-CM

## 2011-04-15 DIAGNOSIS — M79606 Pain in leg, unspecified: Secondary | ICD-10-CM

## 2011-04-15 MED ORDER — TRIAMTERENE-HCTZ 37.5-25 MG PO TABS: 1.0000 | ORAL_TABLET | Freq: Every day | ORAL | Status: DC

## 2011-04-15 MED ORDER — TETANUS-DIPHTH-ACELL PERTUSSIS 5-2.5-18.5 LF-MCG/0.5 IM SUSP
0.5000 mL | Freq: Once | INTRAMUSCULAR | Status: AC
  Administered 2011-04-15: 0.5 mL via INTRAMUSCULAR

## 2011-04-15 NOTE — Patient Instructions (Signed)
It was good to see you again I am getting some labwork to check your kidneys and your cholesterol I'm changing your blood pressure medicines Try decreasing the salt in your diet  I'm referring her to the nutritionist Come back to see me one week Call if any questions God Bless,  [name] MD

## 2011-04-15 NOTE — Progress Notes (Signed)
  Subjective:    Patient ID: Joan Moore, female    DOB: 1991-03-21, 20 y.o.   MRN: [id number]  HPI Pt is here for an acute visit for R leg pain.   Pt states that she has noticed R leg pain over the last month. Pain is mainly in anterior thigh. There has also been some involvement in L anterior thigh as well. Pain is described as a "crampy" type feeling. Patient also reports some mild tingling/numbness in anterior thigh associated with bending and flexion of leg.   HTN: Pt has been on HCTZ for this. Pt has been checking BPs on a regular basis per pt.  Pt states that pressures usually run in the 130s-140s systolic. No CP, SOB. Intermittent HA.   Review of Systems See HPI     Objective:   Physical Exam Gen: up in chair, NAD HEENT: NCAT, EOMI, TMs clear bilaterally CV: RRR, no murmurs auscultated PULM: CTAB, no wheezes, rales, rhoncii ABD: S/NT/+ bowel sounds  EXT: 2+ peripheral pulses Assessment & Plan:

## 2011-04-16 LAB — LIPID PANEL
Cholesterol: 98 mg/dL (ref 0–200)
HDL: 43 mg/dL (ref >39)
Total CHOL/HDL Ratio: 2.3 Ratio
Triglycerides: 41 mg/dL (ref <150)

## 2011-04-16 LAB — COMPREHENSIVE METABOLIC PANEL
BUN: 11 mg/dL (ref 6–23)
CO2: 22 mEq/L (ref 19–32)
Calcium: 9.2 mg/dL (ref 8.4–10.5)
Chloride: 105 mEq/L (ref 96–112)
Creat: 0.66 mg/dL (ref 0.50–1.10)

## 2011-04-17 ENCOUNTER — Encounter: Payer: Self-pay | Admitting: Family Medicine

## 2011-04-17 ENCOUNTER — Telehealth: Payer: Self-pay | Admitting: *Deleted

## 2011-04-17 DIAGNOSIS — L83 Acanthosis nigricans: Secondary | ICD-10-CM | POA: Insufficient documentation

## 2011-04-17 DIAGNOSIS — M79606 Pain in leg, unspecified: Secondary | ICD-10-CM | POA: Insufficient documentation

## 2011-04-17 NOTE — Assessment & Plan Note (Signed)
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 HCTZ induced cramping. Will check electrolytes today. Overall reassuring exam. Stressed weight loss. Will transition pt to maxzide for BP management as this is K neutral.

## 2011-04-17 NOTE — Assessment & Plan Note (Signed)
+   acanthosis on exam in setting of morbid obesity. Pt is at high risk of insulin resistance. Will obtain TSH, CMET, A1C, FLP. Stressed weight loss as well as healthy diet. Will also refer to nutrition.

## 2011-04-17 NOTE — Assessment & Plan Note (Signed)
Elevated today. Repeat manual BP 152/84. Will transition pt to maxzide as this is K neutral in setting of possible hypokalemia induced cramping. Will check Cr and K today. Stressed low salt diet. Will follow up in 2-4 weeks.

## 2011-04-17 NOTE — Telephone Encounter (Signed)
Left message for patient to return call. Please tell her for her nutrition referral she must call [facility] and set up the appointment at [phone].Zen Felling, Rodena Medin

## 2011-04-20 NOTE — Telephone Encounter (Signed)
Pt unavailable. Lorenda Hatchet, Renato Battles

## 2011-04-22 ENCOUNTER — Encounter: Payer: Self-pay | Admitting: Family Medicine

## 2011-04-22 ENCOUNTER — Ambulatory Visit (INDEPENDENT_AMBULATORY_CARE_PROVIDER_SITE_OTHER): Payer: 59 | Admitting: Family Medicine

## 2011-04-22 DIAGNOSIS — I10 Essential (primary) hypertension: Secondary | ICD-10-CM

## 2011-04-22 DIAGNOSIS — Z309 Encounter for contraceptive management, unspecified: Secondary | ICD-10-CM

## 2011-04-22 NOTE — Assessment & Plan Note (Signed)
BP improved with maxzide. Discussed weight loss as well as low salt diet.

## 2011-04-22 NOTE — Progress Notes (Signed)
  Subjective:    Patient ID: Joan Moore, female    DOB: 01-May-1991, 20 y.o.   MRN: [id number]  HPI Patient is here for general followup visit on hypertension.  Patient was recent seen in clinic for a hypertension follow up with patient notices a pressures into the 150s. Patient did report a high salt diet at that time. Patient also had a history of hypokalemia with electrolyte testing. Patient subsequently switched to Maxzide as this is a potassium neutral combination diuretic. Today patient reports continued high salt intake. No headache, chest pain, shortness of breath.  Pt is also smoking up 1/2 PPD. Pt is not ready to quit.   From a birth control standpoint. Pt is currently not on birth control and is undecided Oma particular form of birth control she desires. Patient does not desire dental shot, birth control pills, and,. Patient is somewhat curious about IUD. Patient is currently sexually active with intermittent condom use. Patient does not desire to get pregnant. Leg pain has resolved    Review of Systems See HPI     Objective:   Physical Exam Gen: up in chair, NAD, morbidly obese CV: RRR, no murmurs auscultated PULM: CTAB, no wheezes, rales, rhoncii ABD: S/NT/+ bowel sounds, obese abdomen.  EXT: 2+ peripheral pulses         Assessment & Plan:

## 2011-04-22 NOTE — Patient Instructions (Signed)
1.5 Gram Low Sodium Diet A 1.5 gram sodium diet restricts the amount of sodium in the diet to no more than 1.5 g or 1500 mg daily. The American Heart Association recommends Americans over the age of 20 to consume no more than 1500 mg of sodium each day to reduce the risk of developing high blood pressure. Research also shows that limiting sodium may reduce heart attack and stroke risk. Many foods contain sodium for flavor and sometimes as a preservative. When the amount of sodium in a diet needs to be low, it is important to know what to look for when choosing foods and drinks. The following includes some information and guidelines to help make it easier for you to adapt to a low sodium diet. QUICK TIPS  Do not add salt to food.   Avoid convenience items and fast food.   Choose unsalted snack foods.   Buy lower sodium products, often labeled as "lower sodium" or "no salt added."   Check food labels to learn how much sodium is in 1 serving.   When eating at a restaurant, ask that your food be prepared with less salt or none, if possible.  READING FOOD LABELS FOR SODIUM INFORMATION The nutrition facts label is a good place to find how much sodium is in foods. Look for products with no more than 400 mg of sodium per serving. Remember that 1.5 g = 1500 mg. The food label may also list foods as:  Sodium-free: Less than 5 mg in a serving.   Very low sodium: 35 mg or less in a serving.   Low-sodium: 140 mg or less in a serving.   Light in sodium: 50% less sodium in a serving. For example, if a food that usually has 300 mg of sodium is changed to become light in sodium, it will have 150 mg of sodium.   Reduced sodium: 25% less sodium in a serving. For example, if a food that usually has 400 mg of sodium is changed to reduced sodium, it will have 300 mg of sodium.  CHOOSING FOODS Grains  Avoid: Salted crackers and snack items. Some cereals, including instant hot cereals. Bread stuffing and  biscuit mixes. Seasoned rice or pasta mixes.   Choose: Unsalted snack items. Low-sodium cereals, oats, puffed wheat and rice, shredded wheat. English muffins and bread. Pasta.  Meats  Avoid: Salted, canned, smoked, spiced, pickled meats, including fish and poultry. Bacon, ham, sausage, cold cuts, hot dogs, anchovies.   Choose: Low-sodium canned tuna and salmon. Fresh or frozen meat, poultry, and fish.  Dairy  Avoid: Processed cheese and spreads. Cottage cheese. Buttermilk and condensed milk. Regular cheese.   Choose: Milk. Low-sodium cottage cheese. Yogurt. Sour cream. Low-sodium cheese.  Fruits and Vegetables  Avoid: Regular canned vegetables. Regular canned tomato sauce and paste. Frozen vegetables in sauces. Olives. Pickles. Relishes. Sauerkraut.   Choose: Low-sodium canned vegetables. Low-sodium tomato sauce and paste. Frozen or fresh vegetables. Fresh and frozen fruit.  Condiments  Avoid: Canned and packaged gravies. Worcestershire sauce. Tartar sauce. Barbecue sauce. Soy sauce. Steak sauce. Ketchup. Onion, garlic, and table salt. Meat flavorings and tenderizers.   Choose: Fresh and dried herbs and spices. Low-sodium varieties of mustard and ketchup. Lemon juice. Tabasco sauce. Horseradish.  SAMPLE 1.5 GRAM SODIUM MEAL PLAN Breakfast / Sodium (mg)  1 cup low-fat milk / 143 mg   1 whole-wheat English muffin / 240 mg   1 tbs heart-healthy margarine / 153 mg   1 hard-boiled egg /   139 mg   1 small orange / 0 mg  Lunch / Sodium (mg)  1 cup raw carrots / 76 mg   2 tbs no salt added peanut butter / 5 mg   2 slices whole-wheat bread / 270 mg   1 tbs jelly / 6 mg    cup red grapes / 2 mg  Dinner / Sodium (mg)  1 cup whole-wheat pasta / 2 mg   1 cup low-sodium tomato sauce / 73 mg   3 oz lean ground beef / 57 mg   1 small side salad (1 cup raw spinach leaves,  cup cucumber,  cup yellow bell pepper) with 1 tsp olive oil and 1 tsp red wine vinegar / 25 mg  Snack /  Sodium (mg)  1 container low-fat vanilla yogurt / 107 mg   3 graham cracker squares / 127 mg  Nutrient Analysis  Calories: 1745   Protein: 75 g   Carbohydrate: 237 g   Fat: 57 g   Sodium: 1425 mg  Document Released: 05/25/2005 Document Revised: 02/04/2011 Document Reviewed: 08/26/2009 [facility] Patient Information 2012 [place], [place].Levonorgestrel intrauterine device (IUD) What is this medicine? LEVONORGESTREL IUD (LEE voe nor jes trel) is a contraceptive (birth control) device. It is used to prevent pregnancy and to treat heavy bleeding that occurs during your period. It can be used for up to 5 years. This medicine may be used for other purposes; ask your health care provider or pharmacist if you have questions. What should I tell my health care provider before I take this medicine? They need to know if you have any of these conditions: -abnormal Pap smear -cancer of the breast, uterus, or cervix -diabetes -endometritis -genital or pelvic infection now or in the past -have more than one sexual partner or your partner has more than one partner -heart disease -history of an ectopic or tubal pregnancy -immune system problems -IUD in place -liver disease or tumor -problems with blood clots or take blood-thinners -use intravenous drugs -uterus of unusual shape -vaginal bleeding that has not been explained -an unusual or allergic reaction to levonorgestrel, other hormones, silicone, or polyethylene, medicines, foods, dyes, or preservatives -pregnant or trying to get pregnant -breast-feeding How should I use this medicine? This device is placed inside the uterus by a health care professional. Talk to your pediatrician regarding the use of this medicine in children. Special care may be needed. Overdosage: If you think you have taken too much of this medicine contact a poison control center or emergency room at once. NOTE: This medicine is only for you. Do not share this  medicine with others. What if I miss a dose? This does not apply. What may interact with this medicine? Do not take this medicine with any of the following medications: -amprenavir -bosentan -fosamprenavir This medicine may also interact with the following medications: -aprepitant -barbiturate medicines for inducing sleep or treating seizures -bexarotene -griseofulvin -medicines to treat seizures like carbamazepine, ethotoin, felbamate, oxcarbazepine, phenytoin, topiramate -modafinil -pioglitazone -rifabutin -rifampin -rifapentine -some medicines to treat HIV infection like atazanavir, indinavir, lopinavir, nelfinavir, tipranavir, ritonavir -St. John's wort -warfarin This list may not describe all possible interactions. Give your health care provider a list of all the medicines, herbs, non-prescription drugs, or dietary supplements you use. Also tell them if you smoke, drink alcohol, or use illegal drugs. Some items may interact with your medicine. What should I watch for while using this medicine? Visit your doctor or health care professional for regular check  ups. See your doctor if you or your partner has sexual contact with others, becomes HIV positive, or gets a sexual transmitted disease. This product does not protect you against HIV infection (AIDS) or other sexually transmitted diseases. You can check the placement of the IUD yourself by reaching up to the top of your vagina with clean fingers to feel the threads. Do not pull on the threads. It is a good habit to check placement after each menstrual period. Call your doctor right away if you feel more of the IUD than just the threads or if you cannot feel the threads at all. The IUD may come out by itself. You may become pregnant if the device comes out. If you notice that the IUD has come out use a backup birth control method like condoms and call your health care provider. Using tampons will not change the position of the IUD  and are okay to use during your period. What side effects may I notice from receiving this medicine? Side effects that you should report to your doctor or health care professional as soon as possible: -allergic reactions like skin rash, itching or hives, swelling of the face, lips, or tongue -fever, flu-like symptoms -genital sores -high blood pressure -no menstrual period for 6 weeks during use -pain, swelling, warmth in the leg -pelvic pain or tenderness -severe or sudden headache -signs of pregnancy -stomach cramping -sudden shortness of breath -trouble with balance, talking, or walking -unusual vaginal bleeding, discharge -yellowing of the eyes or skin Side effects that usually do not require medical attention (report to your doctor or health care professional if they continue or are bothersome): -acne -breast pain -change in sex drive or performance -changes in weight -cramping, dizziness, or faintness while the device is being inserted -headache -irregular menstrual bleeding within first 3 to 6 months of use -nausea This list may not describe all possible side effects. Call your doctor for medical advice about side effects. You may report side effects to FDA at 1-800-FDA-1088. Where should I keep my medicine? This does not apply. NOTE: This sheet is a summary. It may not cover all possible information. If you have questions about this medicine, talk to your doctor, pharmacist, or health care provider.  2012, Elsevier/Gold Standard. (06/15/2008 6:39:08 PM)

## 2011-04-22 NOTE — Assessment & Plan Note (Signed)
Discussed BC options. Pt is considering mirena. Handout given about this. Discussed safe sex practices in the interim.

## 2011-07-20 ENCOUNTER — Ambulatory Visit: Payer: 59 | Admitting: Family Medicine

## 2011-09-21 ENCOUNTER — Ambulatory Visit: Payer: 59 | Admitting: Family Medicine

## 2011-12-01 ENCOUNTER — Ambulatory Visit: Payer: 59

## 2011-12-14 ENCOUNTER — Ambulatory Visit: Payer: 59 | Admitting: Family Medicine

## 2011-12-14 ENCOUNTER — Encounter (HOSPITAL_COMMUNITY): Payer: Self-pay | Admitting: *Deleted

## 2011-12-14 ENCOUNTER — Inpatient Hospital Stay (HOSPITAL_COMMUNITY): Payer: 59

## 2011-12-14 ENCOUNTER — Inpatient Hospital Stay (HOSPITAL_COMMUNITY)
Admission: AD | Admit: 2011-12-14 | Discharge: 2011-12-14 | Disposition: A | Discharge status: Home / Self Care | Payer: 59 | Source: Ambulatory Visit | Attending: Obstetrics & Gynecology | Admitting: Obstetrics & Gynecology

## 2011-12-14 DIAGNOSIS — R109 Unspecified abdominal pain: Secondary | ICD-10-CM | POA: Insufficient documentation

## 2011-12-14 DIAGNOSIS — A499 Bacterial infection, unspecified: Secondary | ICD-10-CM | POA: Insufficient documentation

## 2011-12-14 DIAGNOSIS — N760 Acute vaginitis: Secondary | ICD-10-CM

## 2011-12-14 DIAGNOSIS — B9689 Other specified bacterial agents as the cause of diseases classified elsewhere: Secondary | ICD-10-CM | POA: Insufficient documentation

## 2011-12-14 DIAGNOSIS — O0973 Supervision of high risk pregnancy due to social problems, third trimester: Secondary | ICD-10-CM

## 2011-12-14 DIAGNOSIS — O2390 Unspecified genitourinary tract infection in pregnancy, unspecified trimester: Secondary | ICD-10-CM | POA: Insufficient documentation

## 2011-12-14 DIAGNOSIS — Z1389 Encounter for screening for other disorder: Secondary | ICD-10-CM

## 2011-12-14 DIAGNOSIS — Z3490 Encounter for supervision of normal pregnancy, unspecified, unspecified trimester: Secondary | ICD-10-CM

## 2011-12-14 HISTORY — DX: Bipolar disorder, unspecified: F31.9

## 2011-12-14 HISTORY — DX: Essential (primary) hypertension: I10

## 2011-12-14 LAB — URINALYSIS, ROUTINE W REFLEX MICROSCOPIC
Glucose, UA: NEGATIVE mg/dL
Hgb urine dipstick: NEGATIVE
Ketones, ur: NEGATIVE mg/dL
Protein, ur: NEGATIVE mg/dL

## 2011-12-14 LAB — CBC
HCT: 38.2 % (ref 36.0–46.0)
Hemoglobin: 12.5 g/dL (ref 12.0–15.0)
MCH: 27 pg (ref 26.0–34.0)
MCHC: 32.7 g/dL (ref 30.0–36.0)

## 2011-12-14 LAB — WET PREP, GENITAL: Yeast Wet Prep HPF POC: NONE SEEN

## 2011-12-14 LAB — POCT PREGNANCY, URINE: Preg Test, Ur: POSITIVE — AB

## 2011-12-14 MED ORDER — METRONIDAZOLE 500 MG PO TABS: 500.0000 mg | ORAL_TABLET | Freq: Two times a day (BID) | ORAL | Status: AC

## 2011-12-14 NOTE — MAU Provider Note (Signed)
Joan S Poole21 y.o.G2P1001 @[redacted]w[redacted]d  by LMP Chief Complaint  Patient presents with  . Abdominal Cramping  . Possible Pregnancy     First Provider Initiated Contact with Patient 12/14/11 2058      SUBJECTIVE  HPI: Pt presents to MAU with abdominal cramping and back pain all day today and menses one month late.  Patient's last menstrual period was 10/22/2011.  She describes the pain as suprapubic and to the left and right of her umbilical area, sharp, and intermittent.  She has some white vaginal discharge with no odor.  She denies LOF, vaginal bleeding, vaginal itching/burning, urinary symptoms, h/a, dizziness, n/v, or fever/chills.    Past Medical History  Diagnosis Date  . Hypertension   . Bipolar 1 disorder    Past Surgical History  Procedure Date  . Cesarean section    History   Social History  . Marital Status: Single    Spouse Name: N/A    Number of Children: N/A  . Years of Education: N/A   Occupational History  . working    Social History Main Topics  . Smoking status: Current Everyday Smoker -- 0.3 packs/day    Types: Cigarettes  . Smokeless tobacco: Never Used  . Alcohol Use: No  . Drug Use: No  . Sexually Active: Yes    Birth Control/ Protection: None   Other Topics Concern  . Not on file   Social History Narrative  . No narrative on file   No current facility-administered medications on file prior to encounter.   Current Outpatient Prescriptions on File Prior to Encounter  Medication Sig Dispense Refill  . SUMAtriptan (IMITREX) 100 MG tablet Take 100 mg by mouth. One at the onset of the headache, may repeat in 1 hour if needed, do not take more than 2 in 24 hours.        No Known Allergies  ROS: Pertinent items in HPI  OBJECTIVE Blood pressure 136/73, pulse 86, temperature 98.5 F (36.9 C), temperature source Oral, resp. rate 18, height 5\' 2"  (1.575 m), weight 138.801 kg (306 lb), last menstrual period 10/22/2011.  GENERAL: Well-developed,  well-nourished female in no acute distress.  HEENT: Normocephalic, good dentition HEART: normal rate RESP: normal effort ABDOMEN: Soft, nontender, no guarding, no rebound tenderness EXTREMITIES: Nontender, no edema NEURO: Alert and oriented CVA tenderness negative  Pelvic exam: Cervix pink, visually closed, without lesion, scant white creamy discharge, vaginal walls and external genitalia normal Bimanual exam: Cervix 0/long/high, firm, posterior, neg CMT, uterus nontender, nonenlarged, adnexa without tenderness, enlargement, or mass   LAB RESULTS Results for orders placed during the hospital encounter of 12/14/11 (from the past 24 hour(s))  URINALYSIS, ROUTINE W REFLEX MICROSCOPIC     Status: Abnormal   Collection Time   12/14/11  8:00 PM      Component Value Range   Color, Urine YELLOW  YELLOW   APPearance CLEAR  CLEAR   Specific Gravity, Urine >1.030 (*) 1.005 - 1.030   pH 6.0  5.0 - 8.0   Glucose, UA NEGATIVE  NEGATIVE mg/dL   Hgb urine dipstick NEGATIVE  NEGATIVE   Bilirubin Urine NEGATIVE  NEGATIVE   Ketones, ur NEGATIVE  NEGATIVE mg/dL   Protein, ur NEGATIVE  NEGATIVE mg/dL   Urobilinogen, UA 0.2  0.0 - 1.0 mg/dL   Nitrite NEGATIVE  NEGATIVE   Leukocytes, UA NEGATIVE  NEGATIVE  POCT PREGNANCY, URINE     Status: Abnormal   Collection Time   12/14/11  8:24 PM  Component Value Range   Preg Test, Ur POSITIVE (*) NEGATIVE  CBC     Status: Normal   Collection Time   12/14/11  8:48 PM      Component Value Range   WBC 9.0  4.0 - 10.5 K/uL   RBC 4.63  3.87 - 5.11 MIL/uL   Hemoglobin 12.5  12.0 - 15.0 g/dL   HCT 96.0  45.4 - 09.8 %   MCV 82.5  78.0 - 100.0 fL   MCH 27.0  26.0 - 34.0 pg   MCHC 32.7  30.0 - 36.0 g/dL   RDW 11.9  14.7 - 82.9 %   Platelets 307  150 - 400 K/uL  HCG, QUANTITATIVE, PREGNANCY     Status: Abnormal   Collection Time   12/14/11  8:48 PM      Component Value Range   hCG, Beta Chain, Quant, S 56213 (*) <5 mIU/mL  WET PREP, GENITAL     Status:  Abnormal   Collection Time   12/14/11  8:55 PM      Component Value Range   Yeast Wet Prep HPF POC NONE SEEN  NONE SEEN   Trich, Wet Prep NONE SEEN  NONE SEEN   Clue Cells Wet Prep HPF POC MODERATE (*) NONE SEEN   WBC, Wet Prep HPF POC FEW (*) NONE SEEN     IMAGING US Ob Comp Less 14 Wks  12/14/2011  *RADIOLOGY REPORT*  Clinical Data: Abdominal pain and back pain.  OBSTETRIC <14 WK ULTRASOUND  Technique:  Transabdominal ultrasound was performed for evaluation of the gestation as well as the maternal uterus and adnexal regions.  Comparison:  CT of the abdomen and pelvis performed 06/25/2010  Intrauterine gestational sac: Visualized/normal in shape. Yolk sac: Yes Embryo: Yes Cardiac Activity: Yes Heart Rate: 160 bpm  CRL:  1.42 cm  7w  6d         [place] EDC: 07/26/2012  Maternal uterus/Adnexae: A small amount of subchorionic hemorrhage is noted.  The uterus is otherwise unremarkable in appearance.  The lungs are within normal limits.  The right ovary measures 2.1 x 0.8 x 1.3 cm, while the left ovary measures 3.1 x 1.8 x 2.3 cm.  No suspicious adnexal masses are seen; there is no evidence for ovarian torsion.  No free fluid is seen in the pelvic cul-de-sac.  IMPRESSION:  1.  Single live intrauterine pregnancy noted, with a crown rump length of 1.4 cm, corresponding to a gestational age of [redacted] weeks 6 days.  This matches the gestational age of [redacted] weeks 4 days by LMP, reflecting an estimated date of delivery of July 28, 2012. 2.  Small amount of subchorionic hemorrhage noted.  Original Report Authenticated By: Tonia Ghent, M.D.     ASSESSMENT IUP [redacted]w[redacted]d by LMP, confirmed by U/S on 12/14/11  Bacterial vaginosis   PLAN D/C home Flagyl 500 mg BID x 7 days F/U with early prenatal care Return to MAU as needed   LEFTWICH-KIRBY, LISA 12/14/2011 9:34 PM

## 2011-12-14 NOTE — MAU Note (Signed)
Pt c/o low abd cramping and low back pain, pressure with urination, menses one month late.

## 2011-12-15 LAB — GC/CHLAMYDIA PROBE AMP, GENITAL
Chlamydia, DNA Probe: NEGATIVE
GC Probe Amp, Genital: NEGATIVE

## 2011-12-15 NOTE — MAU Provider Note (Signed)
Attestation of Attending Supervision of Advanced Practitioner (CNM/NP): Evaluation and management procedures were performed by the Advanced Practitioner under my supervision and collaboration.  I have reviewed the Advanced Practitioner's note and chart, and I agree with the management and plan.  Azizah Lisle, M.D. 12/15/2011 4:46 AM  

## 2012-01-26 ENCOUNTER — Other Ambulatory Visit: Payer: 59

## 2012-02-02 ENCOUNTER — Encounter: Payer: 59 | Admitting: Family Medicine

## 2012-02-12 ENCOUNTER — Encounter: Payer: Self-pay | Admitting: Family Medicine

## 2012-02-12 ENCOUNTER — Ambulatory Visit (INDEPENDENT_AMBULATORY_CARE_PROVIDER_SITE_OTHER): Payer: 59 | Admitting: Family Medicine

## 2012-02-12 ENCOUNTER — Other Ambulatory Visit (HOSPITAL_COMMUNITY)
Admission: RE | Admit: 2012-02-12 | Discharge: 2012-02-12 | Disposition: A | Discharge status: Home / Self Care | Payer: 59 | Source: Ambulatory Visit | Attending: Family Medicine | Admitting: Family Medicine

## 2012-02-12 VITALS — BP 135/78 | Wt 312.0 lb

## 2012-02-12 DIAGNOSIS — Z3480 Encounter for supervision of other normal pregnancy, unspecified trimester: Secondary | ICD-10-CM

## 2012-02-12 DIAGNOSIS — Z1389 Encounter for screening for other disorder: Secondary | ICD-10-CM

## 2012-02-12 DIAGNOSIS — Z3490 Encounter for supervision of normal pregnancy, unspecified, unspecified trimester: Secondary | ICD-10-CM

## 2012-02-12 DIAGNOSIS — R300 Dysuria: Secondary | ICD-10-CM

## 2012-02-12 DIAGNOSIS — Z01419 Encounter for gynecological examination (general) (routine) without abnormal findings: Secondary | ICD-10-CM | POA: Insufficient documentation

## 2012-02-12 LAB — POCT URINALYSIS DIPSTICK
Glucose, UA: NEGATIVE
Leukocytes, UA: NEGATIVE
Protein, UA: NEGATIVE
Urobilinogen, UA: 0.2

## 2012-02-12 LAB — GLUCOSE, CAPILLARY: Glucose-Capillary: 129 mg/dL — ABNORMAL HIGH (ref 70–99)

## 2012-02-12 LAB — HIV ANTIBODY (ROUTINE TESTING W REFLEX): HIV: NONREACTIVE

## 2012-02-12 NOTE — Patient Instructions (Addendum)
Thank you for coming in today, it was nice to meet you. Since you have a history of chronic hypertension, we are going to have to refer you to high risk at [facility] hospital If you have bleeding or cramping please go to [facility] hospital Call if you have questions.

## 2012-02-13 LAB — OBSTETRIC PANEL
Antibody Screen: NEGATIVE
Basophils Absolute: 0 10*3/uL (ref 0.0–0.1)
Basophils Relative: 0 % (ref 0–1)
Eosinophils Relative: 1 % (ref 0–5)
HCT: 38 % (ref 36.0–46.0)
MCHC: 32.9 g/dL (ref 30.0–36.0)
Monocytes Absolute: 0.6 10*3/uL (ref 0.1–1.0)
Neutro Abs: 5.3 10*3/uL (ref 1.7–7.7)
Platelets: 270 10*3/uL (ref 150–400)
RDW: 14.6 % (ref 11.5–15.5)
WBC: 8.8 10*3/uL (ref 4.0–10.5)

## 2012-02-14 LAB — CULTURE, OB URINE

## 2012-02-15 NOTE — Progress Notes (Signed)
21 yo G2P1001 here at 16w4 days by LMP.  Had first trimester [place] as well that confirmed GA.   She has a hx of chronic HTN, bipolar d/o, and pre-eclampsia with last pregnancy.  Risk factors for GDM are obesity and first degree relative with DM She does complain of some dysuria and frequency.  She denies vaginal bleeding, discharge, cramping/pain.  Only medications currently is PNV.    [facility] Pregnancy Home Risk Screening Form  1. Thinking back to just before you got pregnant how did you feel about becoming pregnant? []   I wanted to be pregnant sooner. []   I wanted to be pregnant now. [x]   I wanted to be pregnant later. []   I did not want to be pregnant then or any time in the future []   I don't know  2.  Within the last year, have you been hit, slapped, kicked or otherwise physically hurt by someone?  No  3.  Are you in a relationship with a person who threatens or physically hurts you? No  4.  Has anyone forced you to have sexual activities that made you feel uncomfortable? No  5.  In the last 12 months were you ever hungry but didn't east because you couldn't afford enough food?  No  6.  Is your living situation unsafe or unstable?  No  7.  Which statement best describes your smoking status?   []   I have never smoked, or have smoked less than 100 cigarettes in my lifetime [x]   I stopped smoking BEFORE I found out I was pregnant and am not smoking now []   I stopped smoking AFTER I found out I was pregnant and am not smoking now []   I smoke now but have cut down some since I found out I was pregnant []   I smoke about the same amount now as I did before I found out I was pregnant  8.  Did any of your parents have a problem with alcohol or other drug use? No  9.  Do any of your friends have a problem with alcohol or other drug use? No  10.  Does your partner have a problem with alcohol or other drug use? No  11.  In the past, have you had difficulties in your life due to alcohol or  other drugs, including prescription medications? No  12.  Before you knew you were pregnant, how often did you drink any alcohol, including beer or wine, or use other drugs?  []   Not at all [x]   Rarely []   Sometimes []   Frequently  13.  In the past month, how often did you drink any alcohol, including beer or wine, or use another drug? [x]   Not at all []   Rarely []   Sometimes []   Frequently   PHQ-9:  Score of 2  O: General appearance: alert, cooperative and morbidly obese Head: Normocephalic, without obvious abnormality, atraumatic Eyes: conjunctivae/corneas clear. PERRL, EOM's intact. Fundi benign. Nose: Nares normal. Septum midline. Mucosa normal. No drainage or sinus tenderness. Throat: lips, mucosa, and tongue normal; teeth and gums normal Neck: no adenopathy, supple, symmetrical, trachea midline and thyroid not enlarged, symmetric, no tenderness/mass/nodules Lungs: clear to auscultation bilaterally Heart: regular rate and rhythm, S1, S2 normal, no murmur, click, rub or gallop Abdomen: soft, non-tender; bowel sounds normal; no masses,  no organomegaly Pelvic: cervix normal in appearance, external genitalia normal, no adnexal masses or tenderness, no cervical motion tenderness, rectovaginal septum normal and vagina normal without  discharge Extremities: extremities normal, atraumatic, no cyanosis or edema Skin: Skin color, texture, turgor normal. No rashes or lesions Neurologic: Grossly normal   A/P:  Prenatal labs done today Pap done.  GC/Chlamydia negative recently Patient elects to have quad screen today 1 hour early glucola today.   Referral to [facility] for chronic HTN

## 2012-02-18 ENCOUNTER — Encounter (HOSPITAL_COMMUNITY): Payer: Self-pay | Admitting: *Deleted

## 2012-02-18 ENCOUNTER — Inpatient Hospital Stay (HOSPITAL_COMMUNITY)
Admission: AD | Admit: 2012-02-18 | Discharge: 2012-02-18 | Disposition: A | Discharge status: Home / Self Care | Payer: 59 | Source: Ambulatory Visit | Attending: Obstetrics & Gynecology | Admitting: Obstetrics & Gynecology

## 2012-02-18 ENCOUNTER — Encounter: Payer: Self-pay | Admitting: Family Medicine

## 2012-02-18 ENCOUNTER — Telehealth: Payer: Self-pay | Admitting: Family Medicine

## 2012-02-18 ENCOUNTER — Ambulatory Visit (INDEPENDENT_AMBULATORY_CARE_PROVIDER_SITE_OTHER): Payer: 59 | Admitting: Family Medicine

## 2012-02-18 VITALS — BP 154/92 | Temp 98.8°F | Wt 311.8 lb

## 2012-02-18 DIAGNOSIS — O99891 Other specified diseases and conditions complicating pregnancy: Secondary | ICD-10-CM | POA: Insufficient documentation

## 2012-02-18 DIAGNOSIS — R109 Unspecified abdominal pain: Secondary | ICD-10-CM

## 2012-02-18 DIAGNOSIS — Z3490 Encounter for supervision of normal pregnancy, unspecified, unspecified trimester: Secondary | ICD-10-CM

## 2012-02-18 DIAGNOSIS — Z331 Pregnant state, incidental: Secondary | ICD-10-CM

## 2012-02-18 DIAGNOSIS — J309 Allergic rhinitis, unspecified: Secondary | ICD-10-CM

## 2012-02-18 DIAGNOSIS — N949 Unspecified condition associated with female genital organs and menstrual cycle: Secondary | ICD-10-CM

## 2012-02-18 DIAGNOSIS — R300 Dysuria: Secondary | ICD-10-CM

## 2012-02-18 HISTORY — DX: Headache: R51

## 2012-02-18 LAB — POCT URINALYSIS DIPSTICK
Bilirubin, UA: NEGATIVE
Glucose, UA: NEGATIVE
Ketones, UA: NEGATIVE
Leukocytes, UA: NEGATIVE

## 2012-02-18 LAB — URINALYSIS, ROUTINE W REFLEX MICROSCOPIC
Bilirubin Urine: NEGATIVE
Nitrite: NEGATIVE
Specific Gravity, Urine: 1.025 (ref 1.005–1.030)
pH: 7.5 (ref 5.0–8.0)

## 2012-02-18 LAB — COMPREHENSIVE METABOLIC PANEL
ALT: 9 U/L (ref 0–35)
Alkaline Phosphatase: 45 U/L (ref 39–117)
CO2: 24 mEq/L (ref 19–32)
Calcium: 9.2 mg/dL (ref 8.4–10.5)
GFR calc Af Amer: >90 mL/min (ref >90)
GFR calc non Af Amer: >90 mL/min (ref >90)
Glucose, Bld: 81 mg/dL (ref 70–99)
Potassium: 3.9 mEq/L (ref 3.5–5.1)
Sodium: 136 mEq/L (ref 135–145)
Total Bilirubin: 0.2 mg/dL — ABNORMAL LOW (ref 0.3–1.2)

## 2012-02-18 LAB — CBC
Hemoglobin: 12.3 g/dL (ref 12.0–15.0)
MCH: 27.8 pg (ref 26.0–34.0)
RBC: 4.43 MIL/uL (ref 3.87–5.11)

## 2012-02-18 LAB — POCT UA - MICROSCOPIC ONLY

## 2012-02-18 NOTE — MAU Provider Note (Signed)
Attestation of Attending Supervision of Advanced Practitioner (CNM/NP): Evaluation and management procedures were performed by the Advanced Practitioner under my supervision and collaboration.  I have reviewed the Advanced Practitioner's note and chart, and I agree with the management and plan.  Sahra Converse, MD, FACOG Attending Obstetrician & Gynecologist Faculty Practice, [facility]  

## 2012-02-18 NOTE — Addendum Note (Signed)
Addended by: [place], Kumari Sculley on: 02/18/2012 04:47 PM   Modules accepted: Orders

## 2012-02-18 NOTE — Progress Notes (Signed)
HPI:  21 yo G2P1001 at 17 weeks via LMP and confirmed with 1st trimester U/S who presents today for sinus congestion and drainage.    1.  Sinus congestion:  Present since yesterday.  Describes rhinorrhea, sinus congestion.  No cough or sore throat.  Feels like ears are "under water."  No sick contacts.  No fevers or chills.  2.  Abdominal pain in pregnancy:  Started about 1 week ago, but acutely worsened this past weekend.  Describes Right sided abdominal pain, initially worse in Right lower quadrant but more on Right mid-abdomen that radiates through to Left side.  Pain is 5-8/10 intensity, depending on day, but does state it is worsening.  Now with nausea of 3 days duration.  No actual vomiting, but nausea to the point she is not taking in much fluid.  Some dysuria for same time period.  No vaginal discharge.  No vaginal bleeding.    PE: Gen:  Alert, cooperative patient who appears stated age in no acute distress.  Vital signs reviewed.  Patient sniffling Head:  /AT Eyes:  EOMI, PERRL.  Sclera and conjunctiva non-erythematous and non-icteric. Nose:  Nares patent.  Turbinates boggy and swollen BL Ears:  Canals clear BL.  TM's pearly gray BL without effusion or retraction Mouth:  MMM.  Tonsils +2 BL without erythema or edema noted Neck:  No lymphadenopathy noted Cardiac:  Regular rate and rhythm without murmur auscultated.  Good S1/S2. Pulm:  Clear to auscultation bilaterally with good air movement.  No wheezes or rales noted.   Abdomen:  Tender to palpation worse on Right lower and mid quadrants.  No radiation.  No guarding or rebound.  Soft/obese.  Good bowel sounds throughout. Ext:  Obese, nontender, no edema  Results for orders placed in visit on 02/12/12  CULTURE, OB URINE      Component Value Range   Colony Count 3,000 COLONIES/ML     Organism ID, Bacteria Insignificant Growth    OBSTETRIC PANEL      Component Value Range   WBC 8.8  4.0 - 10.5 K/uL   RBC 4.50  3.87 - 5.11 MIL/uL   Hemoglobin 12.5  12.0 - 15.0 g/dL   HCT 16.1  09.6 - 04.5 %   MCV 84.4  78.0 - 100.0 fL   MCH 27.8  26.0 - 34.0 pg   MCHC 32.9  30.0 - 36.0 g/dL   RDW 40.9  81.1 - 91.4 %   Platelets 270  150 - 400 K/uL   Neutrophils Relative 60  43 - 77 %   Neutro Abs 5.3  1.7 - 7.7 K/uL   Lymphocytes Relative 32  12 - 46 %   Lymphs Abs 2.8  0.7 - 4.0 K/uL   Monocytes Relative 7  3 - 12 %   Monocytes Absolute 0.6  0.1 - 1.0 K/uL   Eosinophils Relative 1  0 - 5 %   Eosinophils Absolute 0.1  0.0 - 0.7 K/uL   Basophils Relative 0  0 - 1 %   Basophils Absolute 0.0  0.0 - 0.1 K/uL   Smear Review Criteria for review not met     Hepatitis B Surface Ag NEGATIVE  NEGATIVE   RPR NON REAC  NON REAC   Rubella 148.9 (*)    ABO Grouping B     Rh Type POS     Antibody Screen NEG  NEGATIVE  HIV ANTIBODY (ROUTINE TESTING)      Component Value Range   HIV  NON REACTIVE  NON REACTIVE  SICKLE CELL SCREEN      Component Value Range   Sickle Cell Screen NEG  NEG  POCT URINALYSIS DIPSTICK      Component Value Range   Color, UA YELLOW     Clarity, UA CLEAR     Glucose, UA NEG     Bilirubin, UA NEG     Ketones, UA NEG     Spec Grav, UA >=1.030     Blood, UA TRACE-INTACT     pH, UA 6.0     Protein, UA NEG     Urobilinogen, UA 0.2     Nitrite, UA NEG     Leukocytes, UA Negative    GLUCOSE, CAPILLARY      Component Value Range   Glucose-Capillary 129 (*) 70 - 99 mg/dL   Comment 1 1 HR OB    POCT UA - MICROSCOPIC ONLY      Component Value Range   WBC, Ur, HPF, POC 0-2     RBC, urine, microscopic OCC     Bacteria, U Microscopic 2+ RODS & COCCI     Epithelial cells, urine per micros 5-10     Crystals, Ur, HPF, POC MANY CALCIUM OXALATE     A/P: 1. Abdominal pain in pregnancy:  Concern as abdominal pain is increasing.  Round ligament pain a possibility, but in light of persistently elevated blood pressures, protein in urine, nausea and inadequate fluid intake, sending patient to MAU for further monitoring and  fluid resuscitation. Much less likely appendicitis as patient does have lower quadrant and mid-right sided tenderness, but no guarding or rebound. Many oxalate crystals on UA, possible kidney stone though more likely representative of dehydration.  UA negative for UTI.    2.  Sinus congestion:  To restart home Flonase.  Loratadine for relief.  Likely allergic rhinitis. To FU with [place] next week if no improvement.

## 2012-02-18 NOTE — MAU Provider Note (Signed)
History     CSN: [id number]  Arrival date and time: 02/18/12 1639   First Provider Initiated Contact with Patient 02/18/12 1733      Chief Complaint  Patient presents with  . Abdominal Pain   HPI 21 y.o. G2P1001 at [redacted]w[redacted]d with lower ride side pain, intermittent, no bleeding or discharge. No fever, chills, n/v, dysuria. Seen by PCP for sinus congestion/URI today.   Past Medical History  Diagnosis Date  . Hypertension   . Bipolar 1 disorder   . Headache     Past Surgical History  Procedure Date  . Cesarean section     Family History  Problem Relation Age of Onset  . Other Neg Hx     History  Substance Use Topics  . Smoking status: Former Smoker -- 0.3 packs/day    Types: Cigarettes  . Smokeless tobacco: Never Used  . Alcohol Use: No    Allergies: No Known Allergies  No prescriptions prior to admission    Review of Systems  Constitutional: Negative.  Negative for fever and chills.  HENT: Positive for congestion.   Eyes: Negative.   Respiratory: Negative.   Cardiovascular: Negative.   Gastrointestinal: Positive for abdominal pain.  Genitourinary: Negative.   Musculoskeletal: Negative.   Neurological: Negative.   Psychiatric/Behavioral: Negative.    Physical Exam   Blood pressure 115/58, pulse 84, temperature 98.4 F (36.9 C), temperature source Oral, resp. rate 18, height 5' 4.25" (1.632 m), weight 311 lb 6.4 oz (141.25 kg), last menstrual period 10/22/2011, SpO2 100.00%.  Physical Exam  Constitutional: She is oriented to person, place, and time. She appears well-developed and well-nourished. No distress.  HENT:  Head: Normocephalic and atraumatic.  Cardiovascular: Normal rate.   Respiratory: Effort normal. No respiratory distress.  GI: Soft. She exhibits no distension and no mass. There is no tenderness. There is no rebound and no guarding.  Genitourinary: There is no rash or lesion on the right labia. There is no rash or lesion on the left labia.  Uterus is not deviated, not enlarged, not fixed and not tender. Cervix exhibits no motion tenderness, no discharge and no friability. Right adnexum displays no mass, no tenderness and no fullness. Left adnexum displays no mass, no tenderness and no fullness. No erythema, tenderness or bleeding around the vagina. No vaginal discharge found.       Cervix long and closed   Neurological: She is alert and oriented to person, place, and time.  Skin: Skin is warm and dry.  Psychiatric: She has a normal mood and affect.   +FHR MAU Course  Procedures Results for orders placed during the hospital encounter of 02/18/12 (from the past 24 hour(s))  URINALYSIS, ROUTINE W REFLEX MICROSCOPIC     Status: Abnormal   Collection Time   02/18/12  5:00 PM      Component Value Range   Color, Urine YELLOW  YELLOW   APPearance CLOUDY (*) CLEAR   Specific Gravity, Urine 1.025  1.005 - 1.030   pH 7.5  5.0 - 8.0   Glucose, UA NEGATIVE  NEGATIVE mg/dL   Hgb urine dipstick NEGATIVE  NEGATIVE   Bilirubin Urine NEGATIVE  NEGATIVE   Ketones, ur NEGATIVE  NEGATIVE mg/dL   Protein, ur NEGATIVE  NEGATIVE mg/dL   Urobilinogen, UA 0.2  0.0 - 1.0 mg/dL   Nitrite NEGATIVE  NEGATIVE   Leukocytes, UA NEGATIVE  NEGATIVE  CBC     Status: Normal   Collection Time   02/18/12  5:17  PM      Component Value Range   WBC 8.8  4.0 - 10.5 K/uL   RBC 4.43  3.87 - 5.11 MIL/uL   Hemoglobin 12.3  12.0 - 15.0 g/dL   HCT 16.1  09.6 - 04.5 %   MCV 84.0  78.0 - 100.0 fL   MCH 27.8  26.0 - 34.0 pg   MCHC 33.1  30.0 - 36.0 g/dL   RDW 40.9  81.1 - 91.4 %   Platelets 265  150 - 400 K/uL  COMPREHENSIVE METABOLIC PANEL     Status: Abnormal   Collection Time   02/18/12  5:17 PM      Component Value Range   Sodium 136  135 - 145 mEq/L   Potassium 3.9  3.5 - 5.1 mEq/L   Chloride 102  96 - 112 mEq/L   CO2 24  19 - 32 mEq/L   Glucose, Bld 81  70 - 99 mg/dL   BUN 8  6 - 23 mg/dL   Creatinine, Ser 7.82  0.50 - 1.10 mg/dL   Calcium 9.2  8.4 -  95.6 mg/dL   Total Protein 7.4  6.0 - 8.3 g/dL   Albumin 3.0 (*) 3.5 - 5.2 g/dL   AST 11  0 - 37 U/L   ALT 9  0 - 35 U/L   Alkaline Phosphatase 45  39 - 117 U/L   Total Bilirubin 0.2 (*) 0.3 - 1.2 mg/dL   GFR calc non Af Amer >90  >90 mL/min   GFR calc Af Amer >90  >90 mL/min  WET PREP, GENITAL     Status: Abnormal   Collection Time   02/18/12  5:40 PM      Component Value Range   Yeast Wet Prep HPF POC NONE SEEN  NONE SEEN   Trich, Wet Prep NONE SEEN  NONE SEEN   Clue Cells Wet Prep HPF POC NONE SEEN  NONE SEEN   WBC, Wet Prep HPF POC FEW (*) NONE SEEN     Assessment and Plan  21 y.o. G2P1001 at [redacted]w[redacted]d 1. Round ligament pain    Follow up as scheduled, precautions rev'd Joan Moore 02/18/2012, 6:31 PM

## 2012-02-18 NOTE — Telephone Encounter (Signed)
Patient states she has brownish  nasal secretions.  Started with fever yesterday.  Nasal congestion and stuffiness.  Appointment schedule thi afternoon for work in.

## 2012-02-18 NOTE — Progress Notes (Signed)
Rechecked BP--145/84 & P--97  Gaylene Brooks, RN

## 2012-02-18 NOTE — Telephone Encounter (Signed)
Having sinus problems and wants to know what she can take  She is [redacted] weeks pregnant and also needs verification of pregnancy to take to social services

## 2012-02-18 NOTE — MAU Note (Signed)
Patient states she went to see her MD today for sinus congestion and her blood pressure was elevated. States she has been having abdominal and back pain for about 2 weeks. Yellow vaginal discharge for 2 days. Sent to MAU for evaluation.

## 2012-02-18 NOTE — Patient Instructions (Addendum)
It does not appear that you have a UTI. Because of the fact that you are having increasing abdominal pain, have some protein in your urine, and are having increasing blood pressures, I'd like to send you to MAU to be monitored.   They may also decide to repeat your ultrasound because you have the bleeding noted on the first one.    For your sinus problems, start retaking the Flonase on a daily basis.   You can also take Loratadine to help with this as well.  This is over the counter.   If you're still having problems early next week with sinuses, come back and see [place].

## 2012-02-20 LAB — URINE CULTURE: Colony Count: >=100000

## 2012-03-02 ENCOUNTER — Encounter: Payer: 59 | Admitting: Obstetrics & Gynecology

## 2012-03-02 ENCOUNTER — Ambulatory Visit: Payer: 59 | Admitting: Obstetrics & Gynecology

## 2012-03-02 NOTE — Progress Notes (Signed)
Erroneous encounter

## 2012-03-07 ENCOUNTER — Encounter (HOSPITAL_COMMUNITY): Payer: Self-pay | Admitting: *Deleted

## 2012-03-07 ENCOUNTER — Telehealth: Payer: Self-pay | Admitting: *Deleted

## 2012-03-07 ENCOUNTER — Inpatient Hospital Stay (HOSPITAL_COMMUNITY)
Admission: AD | Admit: 2012-03-07 | Discharge: 2012-03-07 | Disposition: A | Discharge status: Hospice | Payer: 59 | Source: Ambulatory Visit | Attending: Obstetrics & Gynecology | Admitting: Obstetrics & Gynecology

## 2012-03-07 DIAGNOSIS — N949 Unspecified condition associated with female genital organs and menstrual cycle: Secondary | ICD-10-CM | POA: Insufficient documentation

## 2012-03-07 DIAGNOSIS — R079 Chest pain, unspecified: Secondary | ICD-10-CM | POA: Insufficient documentation

## 2012-03-07 DIAGNOSIS — O26899 Other specified pregnancy related conditions, unspecified trimester: Secondary | ICD-10-CM

## 2012-03-07 DIAGNOSIS — R0602 Shortness of breath: Secondary | ICD-10-CM | POA: Insufficient documentation

## 2012-03-07 DIAGNOSIS — O99891 Other specified diseases and conditions complicating pregnancy: Secondary | ICD-10-CM | POA: Insufficient documentation

## 2012-03-07 DIAGNOSIS — O9989 Other specified diseases and conditions complicating pregnancy, childbirth and the puerperium: Secondary | ICD-10-CM

## 2012-03-07 LAB — WET PREP, GENITAL
Clue Cells Wet Prep HPF POC: NONE SEEN
Yeast Wet Prep HPF POC: NONE SEEN

## 2012-03-07 LAB — URINALYSIS, ROUTINE W REFLEX MICROSCOPIC
Glucose, UA: NEGATIVE mg/dL
Leukocytes, UA: NEGATIVE
Specific Gravity, Urine: >1.03 — ABNORMAL HIGH (ref 1.005–1.030)
pH: 6 (ref 5.0–8.0)

## 2012-03-07 NOTE — MAU Note (Signed)
Pt reports pelvic pain x 2 weeks off/on, also reports "chest pain and shortness of breath" since she found out she was pregnant. Denies history of asthma.

## 2012-03-07 NOTE — Telephone Encounter (Signed)
Patient calls stating she is having discomfort in low pelvis when she walks or turns over in bed. Has been having problem with turning over since she was 17 weeks.  Recently having problem when she stands up to walk. States bones are popping.  She is now being seen at [facility]. Advised it would be best  If she contact [facility] to see what they recommend. She voices understanding.

## 2012-03-07 NOTE — MAU Provider Note (Signed)
Chief Complaint: Pelvic Pain  First Provider Initiated Contact with Patient 03/07/12 2148     SUBJECTIVE HPI: Joan Moore is a 21 y.o. G2P1001 at [redacted]w[redacted]d by LMP who presents with: 1. Pelvic pain x2 weeks worse w/ walking.  2. She also has SOB and increased HR w/ activity, occasionally at rest w/ gradual onset this pregnancy 3. Mild chest discomfort under left breast intermittently this pregnancy. 4. Black and tarry stools since last Thursday.  No Hx cardiac problems. No pain or tachycardia while in MAU. Describes pain as sharp, focal, under left breast, intermittent, no relationship to activity or tachycardia. Does not radiate. Denies VB, vaginal discharge, urinary complaints, GI complaints, dizziness, blood in stools.  Has not started prenatal care, but has appointment at [facility] for [facility] and Hx Pre-eclampsia.  Past Medical History  Diagnosis Date  . Hypertension   . Bipolar 1 disorder   . Headache    OB History    Grav Para Term Preterm Abortions TAB SAB Ect Mult Living   2 1 1       1      # Outc Date GA Lbr Len/2nd Wgt Sex Del Anes PTL Lv   1 TRM 8/11 [redacted]w[redacted]d  8lb9oz(3.884kg) M LTCS EPI  Yes   Comments: Pre-eclampsia   2 CUR              Past Surgical History  Procedure Date  . Cesarean section    History   Social History  . Marital Status: Single    Spouse Name: N/A    Number of Children: N/A  . Years of Education: N/A   Occupational History  . working    Social History Main Topics  . Smoking status: Former Smoker -- 0.3 packs/day    Types: Cigarettes  . Smokeless tobacco: Never Used  . Alcohol Use: No  . Drug Use: No  . Sexually Active: Yes    Birth Control/ Protection: None   Other Topics Concern  . Not on file   Social History Narrative  . No narrative on file   No current facility-administered medications on file prior to encounter.   Current Outpatient Prescriptions on File Prior to Encounter  Medication Sig Dispense Refill  . Prenatal  Vit-Fe Fumarate-FA (PRENATAL MULTIVITAMIN) TABS Take 1 tablet by mouth daily.       No Known Allergies  ROS: Pertinent items in HPI  OBJECTIVE Blood pressure 121/80, pulse 101, temperature 98.4 F (36.9 C), temperature source Oral, resp. rate 20, height 5\' 2"  (1.575 m), weight 143.337 kg (316 lb), last menstrual period 10/22/2011, SpO2 100.00%. BP 133/68  Pulse 91  Temp 98.4 F (36.9 C) (Oral)  Resp 16   GENERAL: Well-developed, well-nourished female in no acute distress. No diaphoresis, normal color for race.  HEENT: Normocephalic HEART: RRR, no murmurs, rubs or gallops. No chest tenderness.  RESP: Normal effort. No wheezing, rhonchi. Normal rate.  ABDOMEN: Soft, non-tender, obese EXTREMITIES: Nontender, no edema NEURO: Alert and oriented SPECULUM EXAM: NEFG, moderate amount of thin, white, mildly malodorous discharge, no blood noted, cervix clean BIMANUAL: cervix long and closed; uterus 20 week-size, no adnexal tenderness or masses FHR: 150 by doppler  LAB RESULTS Recent Results (from the past 168 hour(s))  URINALYSIS, ROUTINE W REFLEX MICROSCOPIC   Collection Time   03/07/12  8:14 PM      Component Value Range   Color, Urine YELLOW  YELLOW   APPearance HAZY (*) CLEAR   Specific Gravity, Urine >1.030 (*)  1.005 - 1.030   pH 6.0  5.0 - 8.0   Glucose, UA NEGATIVE  NEGATIVE mg/dL   Hgb urine dipstick NEGATIVE  NEGATIVE   Bilirubin Urine NEGATIVE  NEGATIVE   Ketones, ur NEGATIVE  NEGATIVE mg/dL   Protein, ur NEGATIVE  NEGATIVE mg/dL   Urobilinogen, UA 0.2  0.0 - 1.0 mg/dL   Nitrite NEGATIVE  NEGATIVE   Leukocytes, UA NEGATIVE  NEGATIVE  WET PREP, GENITAL   Collection Time   03/07/12  9:50 PM      Component Value Range   Yeast Wet Prep HPF POC NONE SEEN  NONE SEEN   Trich, Wet Prep NONE SEEN  NONE SEEN   Clue Cells Wet Prep HPF POC NONE SEEN  NONE SEEN   WBC, Wet Prep HPF POC FEW (*) NONE SEEN  02/18/12: Hgb 12.4   IMAGING No results found.  MAU  COURSE  ASSESSMENT 1. Pain of round ligament complicating pregnancy, antepartum   2. Nonspecific chest pain, low suspicion of cardiac etiology. Not present during visit.  3.  Normal tachycardia and increased work of breathing of second trimester. 4. Dark stools of unknown etiology. Possibly due to iron in PNV.  PLAN Discharge home Hemocult testing at prenatal appointment if tarry stools persist.  Notify prenatal provider if tachycardic episodes at rest are >140 bpm x >10 minutes or cause you to feel SOB at rest experience chest pain. If Sx severe, go to ED.  Follow-up Information    Follow up with [facility]. On 03/17/2012. (or MAU as needed if symptoms worsen)    Contact information:   [address] [place] [address] [phone]          Medication List     As of 03/07/2012 10:07 PM    ASK your doctor about these medications         prenatal multivitamin Tabs   Take 1 tablet by mouth daily.        [place], CNM 03/07/2012  10:07 PM

## 2012-03-07 NOTE — MAU Note (Signed)
Pt presents for pelvic pain x2weeks that started at night, and now hurts to walk.  She also has SOB and chest pain that has been with pregnancy, and last Thursday stools became black and tarry.

## 2012-03-13 IMAGING — CT CT ABD-PELV W/O CM
2 of 4 series · 17 of 46 positions shown, 19 images · non-contrast
Comparison: None.

CLINICAL DATA: Back and abdominal pain/dysuria

CT ABDOMEN AND PELVIS WITHOUT CONTRAST
TECHNIQUE: Multidetector CT imaging of the abdomen and pelvis was
performed following the standard protocol without intravenous
contrast.

[Series 2: renal stone · axial · 0.98mm/px · z∈[-500,-90]mm · 14 of 90 slices shown, 16 images]
[im 4/90  soft-tissue]
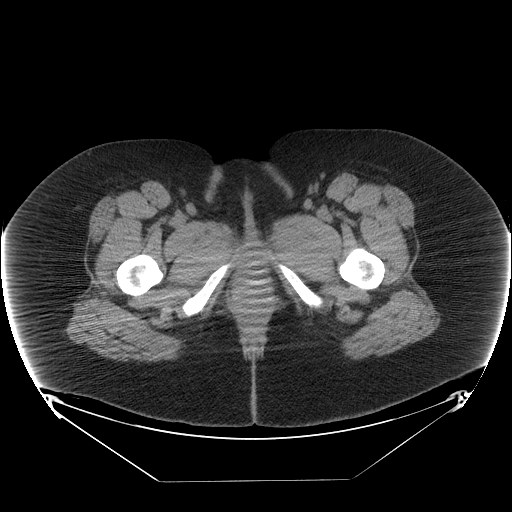
[im 4/90  bone]
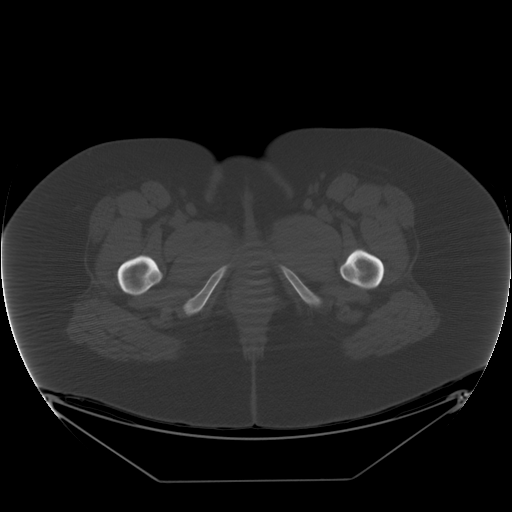
[im 12/90  soft-tissue]
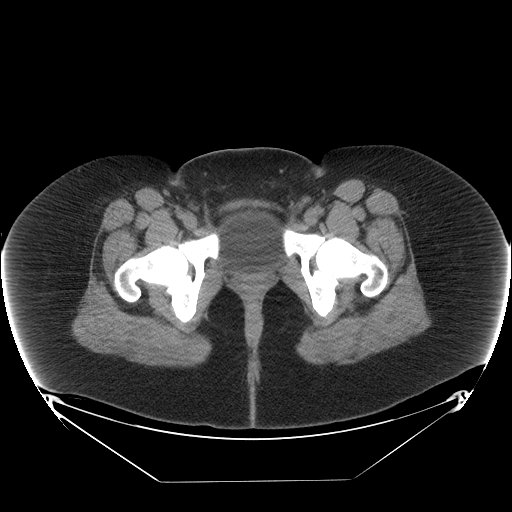
[im 19/90  soft-tissue]
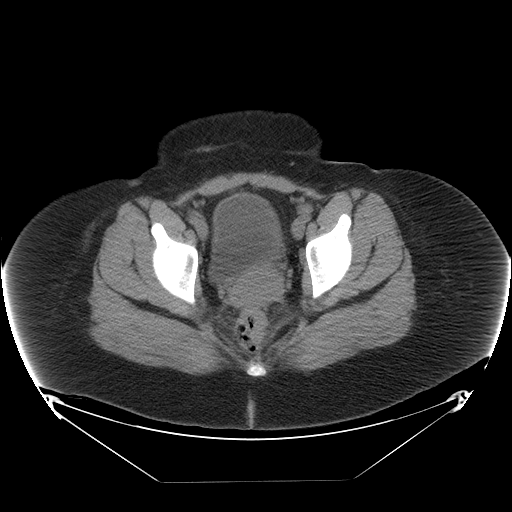
[im 23/90  soft-tissue]
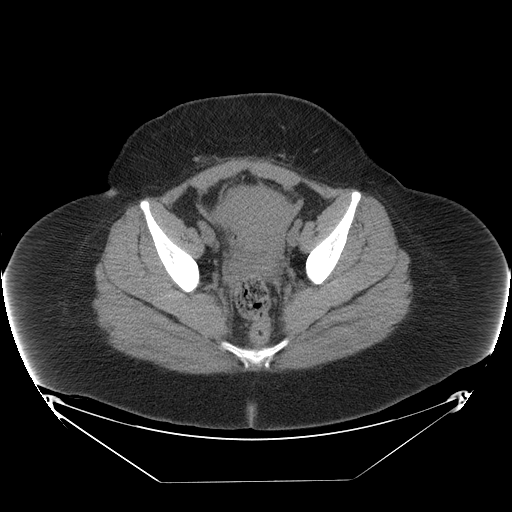
[im 30/90  soft-tissue]
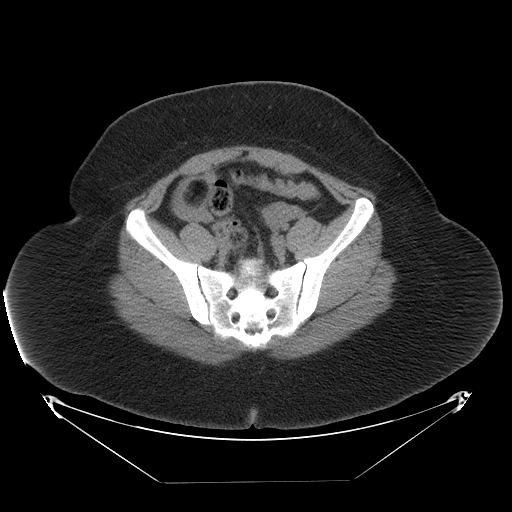
[im 38/90  soft-tissue]
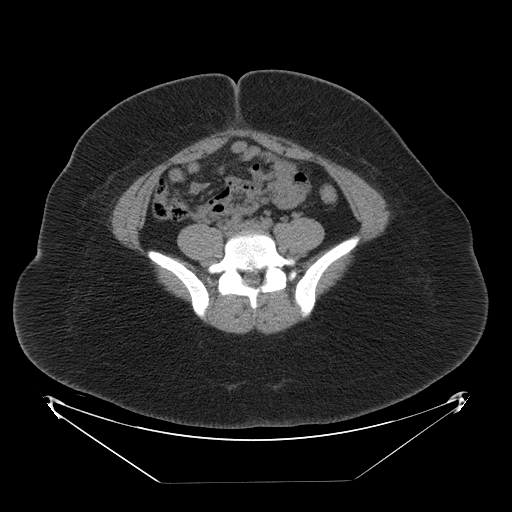
[im 41/90  soft-tissue]
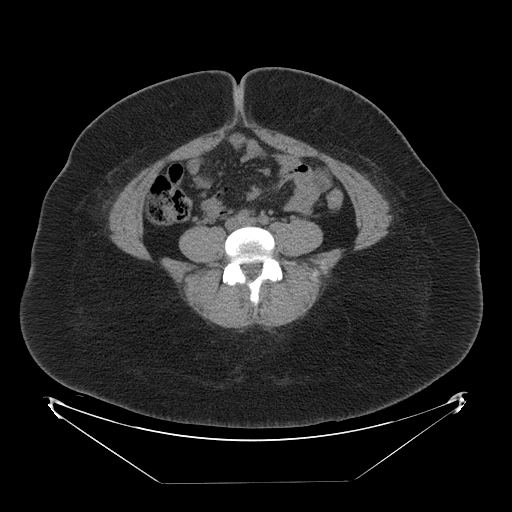
[im 49/90  soft-tissue]
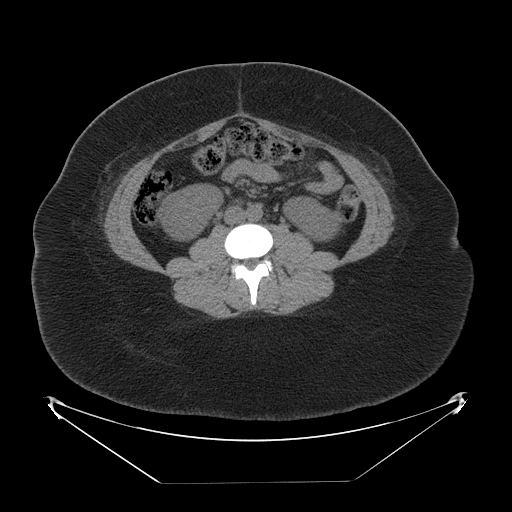
[im 52/90  soft-tissue]
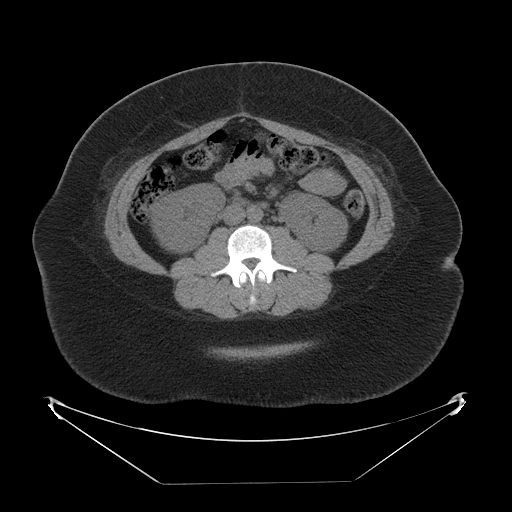
[im 52/90  bone]
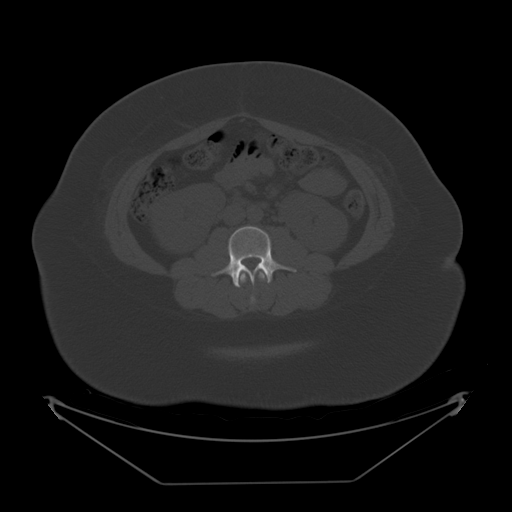
[im 60/90  soft-tissue]
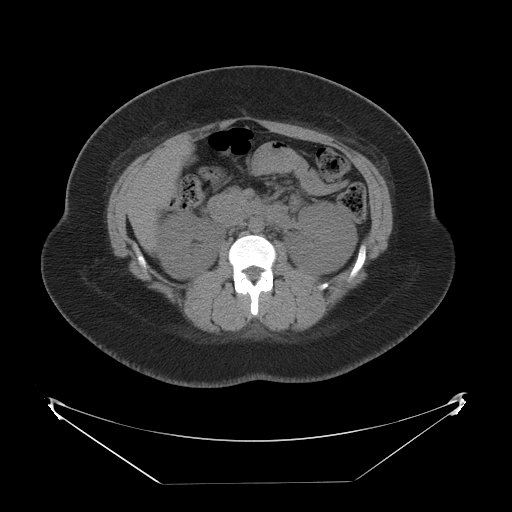
[im 67/90  soft-tissue]
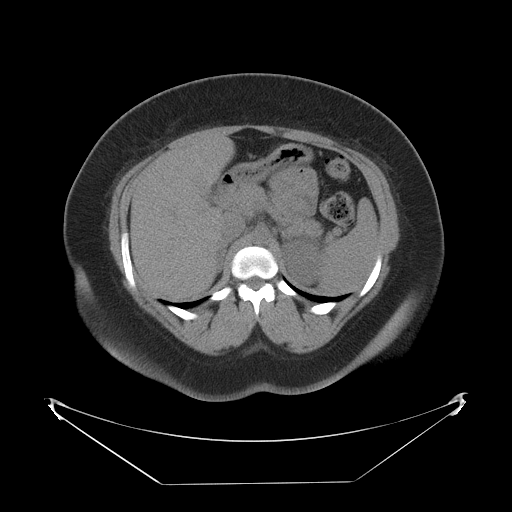
[im 71/90  soft-tissue]
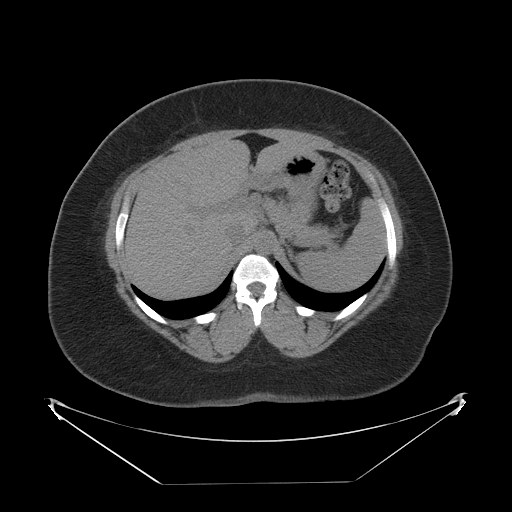
[im 78/90  soft-tissue]
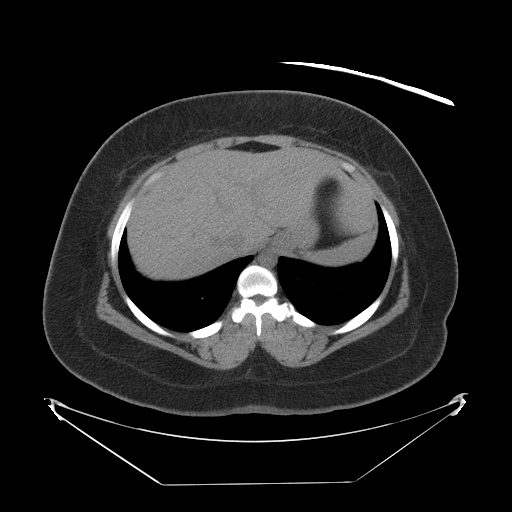
[im 86/90  soft-tissue]
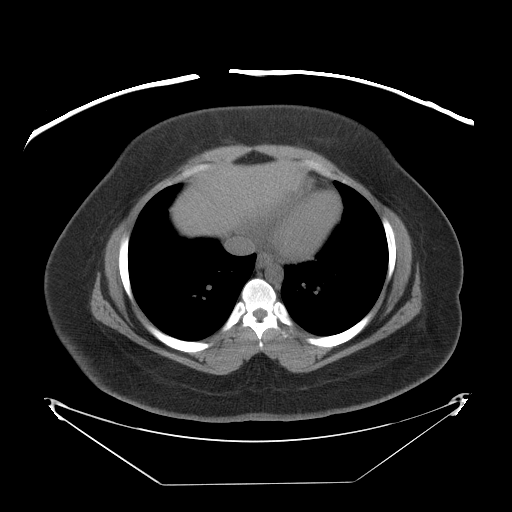

[Series 401: coronal · coronal · 0.98mm/px · 3 of 95 slices shown]
[im 32/95  soft-tissue]
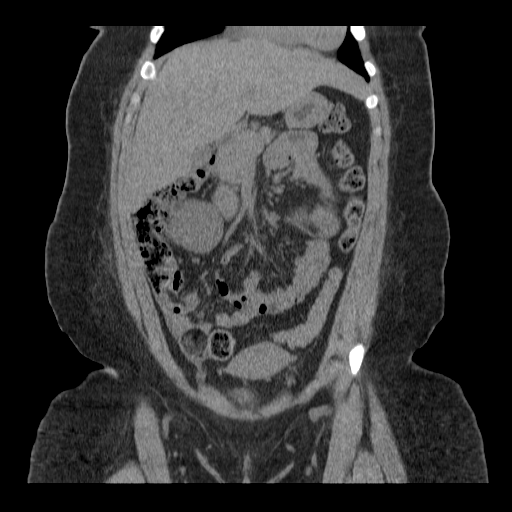
[im 42/95  soft-tissue]
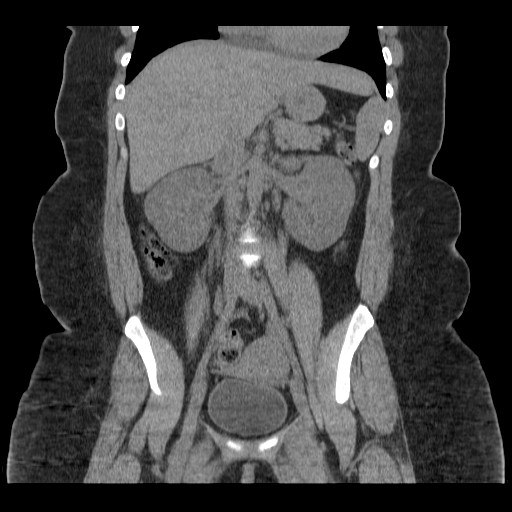
[im 53/95  soft-tissue]
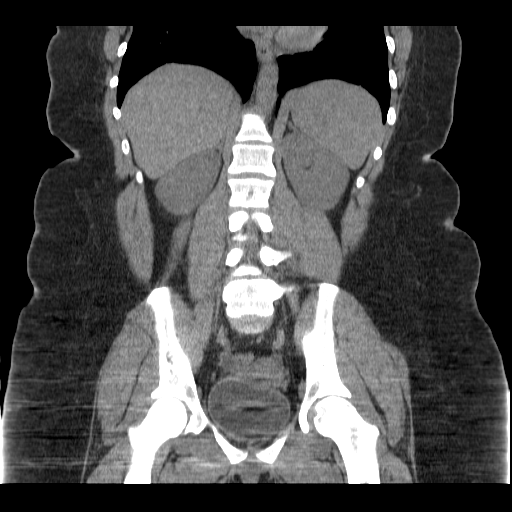

[17 of 46 positions shown; findings below may reference images not displayed]

FINDINGS: The lung bases are clear.  There are no renal calculi or
ureteral calculi.  No perinephric stranding or ureteral dilatation.
No bladder stones or bladder pathology in the unenhanced state.

No adenopathy or ascites.  No masses or fluid collections.
Abdominal viscera unremarkable in the unenhanced state.  No
inflammatory changes of the large or small bowel.  Appendix normal.

No calcified gallstones or biliary dilatation.  No free pelvic
fluid.  Osseous structures normal.
IMPRESSION: 1.  No evidence for urinary tract calculi or obstruction.
2.  No other acute or specific findings in the unenhanced state.

## 2012-03-14 NOTE — MAU Provider Note (Signed)
Attestation of Attending Supervision of Advanced Practitioner (CNM/NP): Evaluation and management procedures were performed by the Advanced Practitioner under my supervision and collaboration.  I have reviewed the Advanced Practitioner's note and chart, and I agree with the management and plan.  Kejuan Bekker, MD, FACOG Attending Obstetrician & Gynecologist Faculty Practice, [facility]   

## 2012-03-17 ENCOUNTER — Encounter: Payer: 59 | Admitting: Advanced Practice Midwife

## 2012-03-28 ENCOUNTER — Encounter: Payer: 59 | Admitting: Obstetrics & Gynecology

## 2012-04-07 NOTE — Progress Notes (Signed)
Note reviewed.  Pt transferred to [facility] for chronic HTN.

## 2012-04-12 ENCOUNTER — Inpatient Hospital Stay (HOSPITAL_COMMUNITY)
Admission: AD | Admit: 2012-04-12 | Discharge: 2012-04-12 | Disposition: A | Discharge status: Home / Self Care | Payer: 59 | Source: Ambulatory Visit | Attending: Family Medicine | Admitting: Family Medicine

## 2012-04-12 ENCOUNTER — Encounter (HOSPITAL_COMMUNITY): Payer: Self-pay | Admitting: Family

## 2012-04-12 ENCOUNTER — Inpatient Hospital Stay (HOSPITAL_COMMUNITY): Payer: 59

## 2012-04-12 DIAGNOSIS — O99891 Other specified diseases and conditions complicating pregnancy: Secondary | ICD-10-CM | POA: Insufficient documentation

## 2012-04-12 DIAGNOSIS — R109 Unspecified abdominal pain: Secondary | ICD-10-CM

## 2012-04-12 DIAGNOSIS — M549 Dorsalgia, unspecified: Secondary | ICD-10-CM | POA: Insufficient documentation

## 2012-04-12 DIAGNOSIS — O26899 Other specified pregnancy related conditions, unspecified trimester: Secondary | ICD-10-CM

## 2012-04-12 LAB — URINALYSIS, ROUTINE W REFLEX MICROSCOPIC
Bilirubin Urine: NEGATIVE
Glucose, UA: NEGATIVE mg/dL
Hgb urine dipstick: NEGATIVE
Ketones, ur: 15 mg/dL — AB
Leukocytes, UA: NEGATIVE
Nitrite: NEGATIVE
Protein, ur: NEGATIVE mg/dL
Specific Gravity, Urine: 1.015 (ref 1.005–1.030)
Urobilinogen, UA: 0.2 mg/dL (ref 0.0–1.0)
pH: 8 (ref 5.0–8.0)

## 2012-04-12 LAB — WET PREP, GENITAL
Clue Cells Wet Prep HPF POC: NONE SEEN
Trich, Wet Prep: NONE SEEN
Yeast Wet Prep HPF POC: NONE SEEN

## 2012-04-12 MED ORDER — HYDROXYZINE HCL 50 MG PO TABS
50.0000 mg | ORAL_TABLET | Freq: Three times a day (TID) | ORAL | Status: DC | PRN
  Administered 2012-04-12: 50 mg via ORAL
  Filled 2012-04-12: qty 1

## 2012-04-12 NOTE — MAU Provider Note (Signed)
History     CSN: [id number]  Arrival date and time: 04/12/12 1826    Joan Moore 21 y.o. G2P1001 [redacted]w[redacted]d   No chief complaint on file.  HPI Patient presented to the MAU this evening with complaints of back pain and left sided pain. She does nor feel like they are contractions. She was a previous c-section for failure to progress and would desire a VBAC for this mode of delivery. She had a subchorionic bleed around the 7th week of this pregnancy. She has not followed through with prenatal care. She admits she spotted two weeks, a small amount throughout the entire day. She again complained she had a spot of brownish blood today. She denies leak or gush of fluid. She rates her pain a 8/10 in her back.    Past Medical History  Diagnosis Date  . Hypertension   . Bipolar 1 disorder   . Headache     Past Surgical History  Procedure Date  . Cesarean section     Family History  Problem Relation Age of Onset  . Other Neg Hx     History  Substance Use Topics  . Smoking status: Former Smoker -- 0.3 packs/day    Types: Cigarettes  . Smokeless tobacco: Never Used  . Alcohol Use: No    Allergies: No Known Allergies  Prescriptions prior to admission  Medication Sig Dispense Refill  . acetaminophen (TYLENOL) 325 MG tablet Take 650 mg by mouth every 6 (six) hours as needed. pain      . Prenatal Vit-Fe Fumarate-FA (PRENATAL MULTIVITAMIN) TABS Take 1 tablet by mouth daily.        Review of Systems  Constitutional: Negative for fever and chills.  Eyes: Negative for blurred vision and double vision.  Respiratory: Negative for shortness of breath.   Cardiovascular: Negative for chest pain.  Gastrointestinal: Negative for heartburn, nausea, vomiting, abdominal pain and diarrhea.  Genitourinary: Negative for dysuria and urgency.  Musculoskeletal: Positive for back pain.       Left flank pain  Skin: Negative for rash.  Neurological: Negative for dizziness, weakness and headaches.     Physical Exam   Blood pressure 154/77, pulse 100, temperature 98.2 F (36.8 C), temperature source Oral, resp. rate 20, height 5' 3.5" (1.613 m), weight 143.79 kg (317 lb), last menstrual period 10/22/2011.  Physical Exam  Constitutional: She is oriented to person, place, and time. She appears well-developed and well-nourished. No distress.       Obese   Cardiovascular: Normal rate, regular rhythm, normal heart sounds and intact distal pulses.   No murmur heard. Respiratory: Effort normal. No respiratory distress. She has no wheezes. She has no rales.  GI: Soft. Bowel sounds are normal. She exhibits no distension and no mass. There is no tenderness. There is no rebound and no guarding.       NO tenderness to palpation left flank, LUQ  Genitourinary: Vaginal discharge found.  Musculoskeletal: She exhibits no edema and no tenderness.       NO CVA tenderness  Neurological: She is alert and oriented to person, place, and time.  Skin: Skin is warm and dry. No rash noted.  Psychiatric:       Flat affect    Dilation: Closed Effacement (%): 60 Cervical Position: Posterior Exam by:: Dr. Claiborne Billings  EFM: 150's FHR: Reassuring. No contractions. MAU Course  Procedures 1. UA 2. EFM 3. Spec 4. Wet prep: 5. Vistaril 6. [place]- limited (placenta location)  Assessment and Plan  1. Uterine irritability/backpain:  - Vistaril 50 mg PO  Kuneff, Renee 04/12/2012, 7:49 PM    Care assumed from Dr. Claiborne Billings. Reviewed wet prep results and ultrasound. Wet prep negative for infection. Ultrasound shows posterior placental attachment without evidence of previa. Encouraged patient to establish care at [facility] and to keep her appointments. States she has an appointment tomorrow. She will need an anatomy scan.  Patient discharged home in stable medical condition. Comfortable at discharge. Given labor precautions and all questions were addressed. Discussed with Philipp Deputy, CNM.  Amber M. Hairford,  M.D. 04/12/2012 9:15 PM   I have seen and examined this patient and I agree with the above. Cam Hai 7:35 AM 04/13/2012

## 2012-04-12 NOTE — MAU Note (Signed)
Had cramping last Thurs, Sat and today.  Had light bleeding earlier today.  Did not call Dr. About a month ago twisted wrong and started having low back pain, last 2 wks and now has started again.

## 2012-04-12 NOTE — MAU Note (Signed)
Pt reports was bleeding lightly today and cramping Saturday and Sunday.  About one month ago she twisted to pick up son and felt a sharp pain in her back. Last Sunday the sharp pain in side and lower back came back. Has been having shooting pain in abdominal and lower back since then.  Feeling good baby movement today.

## 2012-04-13 ENCOUNTER — Encounter: Payer: Self-pay | Admitting: Advanced Practice Midwife

## 2012-04-13 ENCOUNTER — Ambulatory Visit (INDEPENDENT_AMBULATORY_CARE_PROVIDER_SITE_OTHER): Payer: 59 | Admitting: Advanced Practice Midwife

## 2012-04-13 ENCOUNTER — Encounter: Payer: Self-pay | Admitting: *Deleted

## 2012-04-13 VITALS — BP 137/85 | Temp 98.5°F | Wt 316.1 lb

## 2012-04-13 DIAGNOSIS — O0930 Supervision of pregnancy with insufficient antenatal care, unspecified trimester: Secondary | ICD-10-CM

## 2012-04-13 DIAGNOSIS — Z23 Encounter for immunization: Secondary | ICD-10-CM

## 2012-04-13 DIAGNOSIS — Z3480 Encounter for supervision of other normal pregnancy, unspecified trimester: Secondary | ICD-10-CM

## 2012-04-13 LAB — POCT URINALYSIS DIP (DEVICE)
Leukocytes, UA: NEGATIVE
Nitrite: NEGATIVE
Protein, ur: 30 mg/dL — AB
pH: 6.5 (ref 5.0–8.0)

## 2012-04-13 MED ORDER — INFLUENZA VIRUS VACC SPLIT PF IM SUSP: 0.5000 mL | Freq: Once | INTRAMUSCULAR | Status: DC

## 2012-04-13 NOTE — Progress Notes (Signed)
Doing well.  Good fetal movement, denies vaginal bleeding today, LOF, regular contractions. Was seen in MAU yesterday for vaginal spotting--cervix closed, 4.4cm in length, cultures collected.  Subjective:    Joan Moore is a G2P1001 [redacted]w[redacted]d being seen today for her first obstetrical visit.  Her obstetrical history is significant for obesity, pre-eclampsia, C/S x1 and smoker. She quit smoking early in this pregnancy.  Last Pap at family practice in 2013.  Patient does intend to breast feed. Pregnancy history fully reviewed.  Patient reports no bleeding, no cramping and no leaking.  She was seen yesterday in MAU for vaginal spotting which has stopped today.  At yesterday's visit, cervix closed, cervical length 4.4 cm, no problems with placenta visualized.   Filed Vitals:   04/13/12 0817  BP: 137/85  Temp: 98.5 F (36.9 C)  Weight: 316 lb 1.6 oz (143.382 kg)    HISTORY: OB History    Grav Para Term Preterm Abortions TAB SAB Ect Mult Living   2 1 1  0 0 0 0 0 0 1     # Outc Date GA Lbr Len/2nd Wgt Sex Del Anes PTL Lv   1 TRM 8/11 [redacted]w[redacted]d  8lb9oz(3.884kg) M LTCS EPI  Yes   Comments: Pre-eclampsia   2 CUR              Past Medical History  Diagnosis Date  . Hypertension   . Bipolar 1 disorder   . Headache    Past Surgical History  Procedure Date  . Cesarean section    Family History  Problem Relation Age of Onset  . Other Neg Hx   . Diabetes Mother   . Hypertension Mother   . Diabetes Maternal Grandmother   . Hypertension Maternal Grandmother   . Diabetes Maternal Grandfather   . Hypertension Maternal Grandfather      Exam    Uterus:  Fundal Height: 26 cm  Pelvic Exam:    Perineum: Normal perineum, no hemorrhoids   Vulva: normal   Vagina:  GCC collected by vaginal probe   pH:    Cervix: not done--0/60/high, posterior in MAU 11/5   Adnexa: not evaluated   Bony Pelvis: average  System: Breast:  normal appearance, no masses or tenderness   Skin: normal coloration and  turgor, no rashes    Neurologic: oriented, normal, normal mood, gait normal; reflexes normal and symmetric   Extremities: normal strength, tone, and muscle mass, ROM of all joints is normal   HEENT neck supple with midline trachea and thyroid without masses   Mouth/Teeth mucous membranes moist, pharynx normal without lesions   Neck supple and no masses   Cardiovascular: regular rate and rhythm   Respiratory:  appears well, vitals normal, no respiratory distress, acyanotic, normal RR, ear and throat exam is normal, neck free of mass or lymphadenopathy, chest clear, no wheezing, crepitations, rhonchi, normal symmetric air entry   Abdomen: soft, non-tender; bowel sounds normal; no masses,  no organomegaly   Urinary: urethral meatus normal      Assessment:    Pregnancy: G2P1001 Patient Active Problem List  Diagnosis  . OBESITY  . BIPOLAR DEPRESSION  . ECZEMA, ATOPIC DERMATITIS  . MIGRAINE HEADACHE  . Hypertension  . Acanthosis nigricans  . Normal IUP (intrauterine pregnancy) on prenatal ultrasound        Plan:     Initial labs drawn. Prenatal vitamins. Problem list reviewed and updated. Reviewed VBAC vs RLTCS with pt.  Pt undecided at this time. Genetic Screening  discussed U/S, late to care for [facility]: ordered.  Ultrasound discussed; fetal survey: ordered.  Follow up in 4 weeks. 50% of 30 min visit spent on counseling and coordination of care.    LEFTWICH-KIRBY, Thereasa Iannello 04/13/2012

## 2012-04-13 NOTE — Progress Notes (Signed)
Pulse 113 Patient reports occasional pelvic pain and spotting that occurred yesterday unrelated to intercourse Needs OB panel, HIV, 1 hr gtt @ 923, flu shot

## 2012-04-13 NOTE — MAU Provider Note (Signed)
Chart reviewed and agree with management and plan.  

## 2012-04-14 LAB — OBSTETRIC PANEL
Antibody Screen: NEGATIVE
Basophils Absolute: 0 10*3/uL (ref 0.0–0.1)
Basophils Relative: 0 % (ref 0–1)
Hepatitis B Surface Ag: NEGATIVE
MCHC: 33.5 g/dL (ref 30.0–36.0)
Neutro Abs: 6.2 10*3/uL (ref 1.7–7.7)
Neutrophils Relative %: 63 % (ref 43–77)
RDW: 14.5 % (ref 11.5–15.5)

## 2012-04-20 ENCOUNTER — Encounter: Payer: Self-pay | Admitting: *Deleted

## 2012-04-20 ENCOUNTER — Telehealth: Payer: Self-pay | Admitting: *Deleted

## 2012-04-20 NOTE — Telephone Encounter (Signed)
Attempted to call pt in order to schedule Ob detail [place] as ordered. Pt has an invalid # listed and her contact Rolan Lipa Waldrop) states that she does not have another tel # for [employer] and she does expect to see her any time soon.  [place] scheduled for 04/27/12 @ 1030 and certified letter sent to pt.

## 2012-04-27 ENCOUNTER — Ambulatory Visit (HOSPITAL_COMMUNITY): Admission: RE | Admit: 2012-04-27 | Payer: 59 | Source: Ambulatory Visit

## 2012-05-11 ENCOUNTER — Ambulatory Visit (INDEPENDENT_AMBULATORY_CARE_PROVIDER_SITE_OTHER): Payer: 59 | Admitting: Obstetrics & Gynecology

## 2012-05-11 VITALS — BP 135/84 | Temp 98.4°F | Wt 320.7 lb

## 2012-05-11 DIAGNOSIS — O0930 Supervision of pregnancy with insufficient antenatal care, unspecified trimester: Secondary | ICD-10-CM

## 2012-05-11 DIAGNOSIS — O10019 Pre-existing essential hypertension complicating pregnancy, unspecified trimester: Secondary | ICD-10-CM

## 2012-05-11 DIAGNOSIS — O10919 Unspecified pre-existing hypertension complicating pregnancy, unspecified trimester: Secondary | ICD-10-CM

## 2012-05-11 LAB — POCT URINALYSIS DIP (DEVICE)
Bilirubin Urine: NEGATIVE
Glucose, UA: 100 mg/dL — AB
Nitrite: NEGATIVE

## 2012-05-11 NOTE — Progress Notes (Signed)
P = 100 Mild edema in LE Lower abdomen pressure

## 2012-05-11 NOTE — Patient Instructions (Signed)

## 2012-05-11 NOTE — Progress Notes (Signed)
Transfer to [facility] d/t h/o HTN, was on HCTZ which she stopped on her own. Needs detailed [place], 24 hr urine and CMP

## 2012-05-17 ENCOUNTER — Ambulatory Visit (HOSPITAL_COMMUNITY)
Admission: RE | Admit: 2012-05-17 | Discharge: 2012-05-17 | Disposition: A | Discharge status: Home / Self Care | Payer: 59 | Source: Ambulatory Visit | Attending: Obstetrics & Gynecology | Admitting: Obstetrics & Gynecology

## 2012-05-17 ENCOUNTER — Other Ambulatory Visit: Payer: 59

## 2012-05-17 DIAGNOSIS — Z363 Encounter for antenatal screening for malformations: Secondary | ICD-10-CM | POA: Insufficient documentation

## 2012-05-17 DIAGNOSIS — O0930 Supervision of pregnancy with insufficient antenatal care, unspecified trimester: Secondary | ICD-10-CM

## 2012-05-17 DIAGNOSIS — O10019 Pre-existing essential hypertension complicating pregnancy, unspecified trimester: Secondary | ICD-10-CM | POA: Insufficient documentation

## 2012-05-17 DIAGNOSIS — O358XX0 Maternal care for other (suspected) fetal abnormality and damage, not applicable or unspecified: Secondary | ICD-10-CM | POA: Insufficient documentation

## 2012-05-17 DIAGNOSIS — O34219 Maternal care for unspecified type scar from previous cesarean delivery: Secondary | ICD-10-CM | POA: Insufficient documentation

## 2012-05-17 DIAGNOSIS — Z1389 Encounter for screening for other disorder: Secondary | ICD-10-CM | POA: Insufficient documentation

## 2012-05-17 DIAGNOSIS — O10919 Unspecified pre-existing hypertension complicating pregnancy, unspecified trimester: Secondary | ICD-10-CM

## 2012-05-17 DIAGNOSIS — O09299 Supervision of pregnancy with other poor reproductive or obstetric history, unspecified trimester: Secondary | ICD-10-CM | POA: Insufficient documentation

## 2012-05-17 LAB — COMPREHENSIVE METABOLIC PANEL
ALT: 8 U/L (ref 0–35)
AST: 12 U/L (ref 0–37)
Albumin: 3.4 g/dL — ABNORMAL LOW (ref 3.5–5.2)
BUN: 6 mg/dL (ref 6–23)
Calcium: 9 mg/dL (ref 8.4–10.5)
Chloride: 104 mEq/L (ref 96–112)
Potassium: 4.1 mEq/L (ref 3.5–5.3)
Sodium: 134 mEq/L — ABNORMAL LOW (ref 135–145)
Total Protein: 6.8 g/dL (ref 6.0–8.3)

## 2012-05-17 NOTE — Addendum Note (Signed)
Addended by: Franchot Mimes on: 05/17/2012 12:26 PM   Modules accepted: Orders

## 2012-05-18 LAB — CREATININE CLEARANCE, URINE, 24 HOUR: Creatinine: 0.47 mg/dL — ABNORMAL LOW (ref 0.50–1.10)

## 2012-05-19 ENCOUNTER — Ambulatory Visit (INDEPENDENT_AMBULATORY_CARE_PROVIDER_SITE_OTHER): Payer: 59 | Admitting: Family

## 2012-05-19 VITALS — BP 141/90 | Temp 97.5°F | Wt 318.1 lb

## 2012-05-19 DIAGNOSIS — Z98891 History of uterine scar from previous surgery: Secondary | ICD-10-CM

## 2012-05-19 DIAGNOSIS — O34219 Maternal care for unspecified type scar from previous cesarean delivery: Secondary | ICD-10-CM

## 2012-05-19 DIAGNOSIS — O0990 Supervision of high risk pregnancy, unspecified, unspecified trimester: Secondary | ICD-10-CM

## 2012-05-19 DIAGNOSIS — O10919 Unspecified pre-existing hypertension complicating pregnancy, unspecified trimester: Secondary | ICD-10-CM

## 2012-05-19 DIAGNOSIS — O10019 Pre-existing essential hypertension complicating pregnancy, unspecified trimester: Secondary | ICD-10-CM

## 2012-05-19 LAB — POCT URINALYSIS DIP (DEVICE)
Bilirubin Urine: NEGATIVE
Glucose, UA: NEGATIVE mg/dL
Ketones, ur: NEGATIVE mg/dL
Specific Gravity, Urine: >=1.03 (ref 1.005–1.030)

## 2012-05-19 NOTE — Progress Notes (Signed)
Reviewed 24 hr results/ultrasound; No PIH symptoms; reviewed TOLAC and risks>pt desires TOLAC, consent obtained.

## 2012-05-26 ENCOUNTER — Encounter: Payer: 59 | Admitting: Obstetrics & Gynecology

## 2012-07-05 ENCOUNTER — Encounter: Payer: 59 | Admitting: Obstetrics & Gynecology

## 2012-07-15 ENCOUNTER — Inpatient Hospital Stay (HOSPITAL_COMMUNITY)
Admission: AD | Admit: 2012-07-15 | Discharge: 2012-07-19 | DRG: 765 | Disposition: A | Discharge status: Home / Self Care | Payer: 59 | Source: Ambulatory Visit | Attending: Obstetrics & Gynecology | Admitting: Obstetrics & Gynecology

## 2012-07-15 DIAGNOSIS — O339 Maternal care for disproportion, unspecified: Secondary | ICD-10-CM | POA: Diagnosis present

## 2012-07-15 DIAGNOSIS — O324XX0 Maternal care for high head at term, not applicable or unspecified: Secondary | ICD-10-CM | POA: Diagnosis present

## 2012-07-15 DIAGNOSIS — O330 Maternal care for disproportion due to deformity of maternal pelvic bones: Principal | ICD-10-CM | POA: Diagnosis present

## 2012-07-15 DIAGNOSIS — Z98891 History of uterine scar from previous surgery: Secondary | ICD-10-CM

## 2012-07-15 DIAGNOSIS — O0990 Supervision of high risk pregnancy, unspecified, unspecified trimester: Secondary | ICD-10-CM

## 2012-07-15 DIAGNOSIS — O0930 Supervision of pregnancy with insufficient antenatal care, unspecified trimester: Secondary | ICD-10-CM

## 2012-07-15 DIAGNOSIS — O34219 Maternal care for unspecified type scar from previous cesarean delivery: Secondary | ICD-10-CM | POA: Diagnosis present

## 2012-07-15 DIAGNOSIS — O1002 Pre-existing essential hypertension complicating childbirth: Secondary | ICD-10-CM | POA: Diagnosis present

## 2012-07-15 DIAGNOSIS — O480 Post-term pregnancy: Secondary | ICD-10-CM | POA: Diagnosis present

## 2012-07-15 LAB — OB RESULTS CONSOLE GBS: GBS: NEGATIVE

## 2012-07-15 LAB — POCT FERN TEST: POCT Fern Test: NEGATIVE

## 2012-07-15 LAB — GROUP B STREP BY PCR: Group B strep by PCR: NEGATIVE

## 2012-07-15 LAB — AMNISURE RUPTURE OF MEMBRANE (ROM) NOT AT ARMC: Amnisure ROM: NEGATIVE

## 2012-07-15 MED ORDER — SODIUM CHLORIDE 0.9 % IV BOLUS (SEPSIS): 500.0000 mL | Freq: Once | INTRAVENOUS | Status: DC

## 2012-07-15 MED ORDER — SODIUM CHLORIDE 0.9 % IV SOLN: INTRAVENOUS | Status: DC

## 2012-07-15 MED ORDER — LACTATED RINGERS IV SOLN: INTRAVENOUS | Status: DC

## 2012-07-15 MED ORDER — FENTANYL CITRATE 0.05 MG/ML IJ SOLN
100.0000 ug | Freq: Once | INTRAMUSCULAR | Status: AC
  Administered 2012-07-15: 100 ug via INTRAVENOUS
  Filled 2012-07-15: qty 2

## 2012-07-15 MED ORDER — LACTATED RINGERS IV BOLUS (SEPSIS)
500.0000 mL | Freq: Once | INTRAVENOUS | Status: AC
  Administered 2012-07-15: 500 mL via INTRAVENOUS

## 2012-07-15 NOTE — ED Notes (Signed)
Chief Complaint:  Labor Eval  Contractions    HPI: Joan Moore is a 22 y.o. G2P1001 at [redacted]w[redacted]d who presents to maternity admissions reporting worsening contractions starting today. Feeling contractions every 2-4 minutes.  Reports some leakage of fluid. No vaginal bleeding. Good fetal movement.   Pregnancy Course:  [facility] by [facility] for history of pre-eclampsia in last pregnancy. last visit: 05/19/12, with normal 24hr urine and [place].  TOLAC with consent obtained   Past Medical History: Past Medical History  Diagnosis Date  . Hypertension   . Bipolar 1 disorder   . Headache     Past obstetric history: OB History    Grav Para Term Preterm Abortions TAB SAB Ect Mult Living   2 1 1  0 0 0 0 0 0 1     # Outc Date GA Lbr Len/2nd Wgt Sex Del Anes PTL Lv   1 TRM 8/11 [redacted]w[redacted]d  8lb9oz(3.884kg) M LTCS EPI  Yes   Comments: Pre-eclampsia   2 CUR               Past Surgical History: Past Surgical History  Procedure Date  . Cesarean section     Family History: Family History  Problem Relation Age of Onset  . Other Neg Hx   . Diabetes Mother   . Hypertension Mother   . Diabetes Maternal Grandmother   . Hypertension Maternal Grandmother   . Diabetes Maternal Grandfather   . Hypertension Maternal Grandfather     Social History: History  Substance Use Topics  . Smoking status: Former Smoker -- 0.3 packs/day    Types: Cigarettes  . Smokeless tobacco: Never Used  . Alcohol Use: No    Allergies: No Known Allergies  Meds:  Prescriptions prior to admission  Medication Sig Dispense Refill  . acetaminophen (TYLENOL) 325 MG tablet Take 650 mg by mouth every 6 (six) hours as needed. pain      . Prenatal Vit-Fe Fumarate-FA (PRENATAL MULTIVITAMIN) TABS Take 1 tablet by mouth daily.        ROS: Pertinent findings in history of present illness.  Physical Exam  Blood pressure 119/70, pulse 106, temperature 98.2 F (36.8 C), temperature source Oral, resp. rate 22, height 5'  3.5" (1.613 m), weight 334 lb (151.501 kg), last menstrual period 10/22/2011, SpO2 99.00%. GENERAL: Well-developed, well-nourished female in no acute distress.  HEENT: normocephalic HEART: normal rate RESP: normal effort ABDOMEN: Soft, non-tender, gravid appropriate for gestational age EXTREMITIES: Nontender, no edema NEURO: alert and oriented  Dilation: 3 Effacement (%): 50 Cervical Position: Middle Presentation: Vertex Exam by:: Abuk Selleck MD   FHT:  Baseline 145 , moderate variability, accelerations present, no decelerations Contractions: q 3 mins, per patient report and presentation, difficult to capture on monitor.    Labs: Results for orders placed during the hospital encounter of 07/15/12 (from the past 24 hour(s))  POCT FERN TEST     Status: Normal   Collection Time   07/15/12  7:55 PM      Component Value Range   POCT Fern Test Negative = intact amniotic membranes    AMNISURE RUPTURE OF MEMBRANE (ROM)     Status: Normal   Collection Time   07/15/12  8:00 PM      Component Value Range   Amnisure ROM NEGATIVE      Imaging:  No results found. MAU Course:  - amnisure and fern negative. Patient given IV LR and fentanyl and re-checked for cervical change without significant change.  -  BP found to be elevated at 140/90. No current headache or RUQ pain. Obtained CMP, CBC and Pr/Cr which were all normal. Pr/Cr: 0.18, AST/ALT: 15/9, platelets: 268, Cr: 0.57.  - variables on strip. With elevated BP's, obtained BPP with AFI: 8/8 with normal AFI.  - patient about to be discharged home given no obvious cervical change, still at 3-4cm, but BP's upon discharge checked were 161/106 and 150/105. Given elevated BP's and latent phase of labor and TOLAC, admitted patient to L+D.    Assessment: Joan Moore is a 22 y.o. G2P1001 at [redacted]w[redacted]d with h/o previous C/section, who presented for contractions and ?fluid loss, being admitted for labor management in setting of elevated BP's and TOLAC.   Plan: Admit to L+D.     Medication List     As of 07/15/2012 10:10 PM    ASK your doctor about these medications         acetaminophen 325 MG tablet   Commonly known as: TYLENOL   Take 650 mg by mouth every 6 (six) hours as needed. pain      prenatal multivitamin Tabs   Take 1 tablet by mouth daily.        Lonia Skinner, MD 07/15/2012 10:10 PM

## 2012-07-15 NOTE — MAU Note (Signed)
Patient presents to MAU with c/o contractions since 1500 and clear LOF since 1800 today. Patient is seen in [facility] d/t hypertension. Reports + fetal movement. Denies vaginal bleeding.

## 2012-07-15 NOTE — MAU Note (Signed)
Contractions every , started this afternoon.  Water started leaking around 1800, no bleeding.

## 2012-07-16 ENCOUNTER — Encounter (HOSPITAL_COMMUNITY): Payer: Self-pay | Admitting: Anesthesiology

## 2012-07-16 ENCOUNTER — Encounter (HOSPITAL_COMMUNITY)
Admission: AD | Disposition: A | Discharge status: Home / Self Care | Payer: Self-pay | Source: Ambulatory Visit | Attending: Obstetrics & Gynecology

## 2012-07-16 ENCOUNTER — Inpatient Hospital Stay (HOSPITAL_COMMUNITY): Payer: 59 | Admitting: Anesthesiology

## 2012-07-16 ENCOUNTER — Encounter (HOSPITAL_COMMUNITY): Payer: Self-pay | Admitting: *Deleted

## 2012-07-16 ENCOUNTER — Inpatient Hospital Stay (HOSPITAL_COMMUNITY): Payer: 59

## 2012-07-16 DIAGNOSIS — O330 Maternal care for disproportion due to deformity of maternal pelvic bones: Secondary | ICD-10-CM

## 2012-07-16 DIAGNOSIS — O339 Maternal care for disproportion, unspecified: Secondary | ICD-10-CM

## 2012-07-16 DIAGNOSIS — O1002 Pre-existing essential hypertension complicating childbirth: Secondary | ICD-10-CM

## 2012-07-16 LAB — RAPID URINE DRUG SCREEN, HOSP PERFORMED
Barbiturates: NOT DETECTED
Benzodiazepines: NOT DETECTED
Cocaine: NOT DETECTED
Opiates: NOT DETECTED

## 2012-07-16 LAB — CBC
HCT: 33.6 % — ABNORMAL LOW (ref 36.0–46.0)
HCT: 38.1 % (ref 36.0–46.0)
Hemoglobin: 11 g/dL — ABNORMAL LOW (ref 12.0–15.0)
Hemoglobin: 12.5 g/dL (ref 12.0–15.0)
MCHC: 32.8 g/dL (ref 30.0–36.0)
MCV: 82.6 fL (ref 78.0–100.0)
Platelets: 240 10*3/uL (ref 150–400)
RBC: 4.07 MIL/uL (ref 3.87–5.11)
RDW: 14.9 % (ref 11.5–15.5)
RDW: 14.9 % (ref 11.5–15.5)
RDW: 15 % (ref 11.5–15.5)
WBC: 13.5 10*3/uL — ABNORMAL HIGH (ref 4.0–10.5)
WBC: 9 10*3/uL (ref 4.0–10.5)
WBC: 9.1 10*3/uL (ref 4.0–10.5)

## 2012-07-16 LAB — COMPREHENSIVE METABOLIC PANEL
ALT: 8 U/L (ref 0–35)
ALT: 9 U/L (ref 0–35)
AST: 14 U/L (ref 0–37)
Albumin: 2.4 g/dL — ABNORMAL LOW (ref 3.5–5.2)
Alkaline Phosphatase: 164 U/L — ABNORMAL HIGH (ref 39–117)
Alkaline Phosphatase: 172 U/L — ABNORMAL HIGH (ref 39–117)
BUN: 8 mg/dL (ref 6–23)
CO2: 19 mEq/L (ref 19–32)
Calcium: 9.4 mg/dL (ref 8.4–10.5)
Chloride: 100 mEq/L (ref 96–112)
Creatinine, Ser: 0.61 mg/dL (ref 0.50–1.10)
GFR calc Af Amer: >90 mL/min (ref >90)
GFR calc non Af Amer: >90 mL/min (ref >90)
Glucose, Bld: 81 mg/dL (ref 70–99)
Potassium: 4.2 mEq/L (ref 3.5–5.1)
Sodium: 133 mEq/L — ABNORMAL LOW (ref 135–145)
Total Bilirubin: 0.4 mg/dL (ref 0.3–1.2)
Total Protein: 6.6 g/dL (ref 6.0–8.3)

## 2012-07-16 LAB — URINALYSIS, ROUTINE W REFLEX MICROSCOPIC
Ketones, ur: 40 mg/dL — AB
Leukocytes, UA: NEGATIVE
Protein, ur: 30 mg/dL — AB
Urobilinogen, UA: 0.2 mg/dL (ref 0.0–1.0)

## 2012-07-16 LAB — RPR: RPR Ser Ql: NONREACTIVE

## 2012-07-16 LAB — TYPE AND SCREEN
ABO/RH(D): B POS
Antibody Screen: NEGATIVE

## 2012-07-16 LAB — PROTEIN / CREATININE RATIO, URINE: Creatinine, Urine: 255.77 mg/dL

## 2012-07-16 LAB — URINE MICROSCOPIC-ADD ON

## 2012-07-16 SURGERY — Surgical Case
Anesthesia: Regional | Site: Abdomen | Wound class: Clean Contaminated

## 2012-07-16 MED ORDER — ONDANSETRON HCL 4 MG/2ML IJ SOLN: 4.0000 mg | Freq: Three times a day (TID) | INTRAMUSCULAR | Status: DC | PRN

## 2012-07-16 MED ORDER — SODIUM CHLORIDE 0.9 % IJ SOLN: 3.0000 mL | INTRAMUSCULAR | Status: DC | PRN

## 2012-07-16 MED ORDER — MENTHOL 3 MG MT LOZG: 1.0000 | LOZENGE | OROMUCOSAL | Status: DC | PRN

## 2012-07-16 MED ORDER — DIPHENHYDRAMINE HCL 50 MG/ML IJ SOLN: 12.5000 mg | INTRAMUSCULAR | Status: DC | PRN

## 2012-07-16 MED ORDER — ACETAMINOPHEN 325 MG PO TABS: 650.0000 mg | ORAL_TABLET | ORAL | Status: DC | PRN

## 2012-07-16 MED ORDER — MORPHINE SULFATE (PF) 0.5 MG/ML IJ SOLN
INTRAMUSCULAR | Status: DC | PRN
  Administered 2012-07-16: 4 mg via EPIDURAL

## 2012-07-16 MED ORDER — SENNOSIDES-DOCUSATE SODIUM 8.6-50 MG PO TABS
2.0000 | ORAL_TABLET | Freq: Every evening | ORAL | Status: DC | PRN
  Administered 2012-07-17 – 2012-07-18 (×2): 2 via ORAL

## 2012-07-16 MED ORDER — ONDANSETRON HCL 4 MG/2ML IJ SOLN: 4.0000 mg | Freq: Four times a day (QID) | INTRAMUSCULAR | Status: DC | PRN

## 2012-07-16 MED ORDER — ZOLPIDEM TARTRATE 5 MG PO TABS
5.0000 mg | ORAL_TABLET | Freq: Once | ORAL | Status: DC
  Administered 2012-07-16: 5 mg via ORAL

## 2012-07-16 MED ORDER — SIMETHICONE 80 MG PO CHEW
80.0000 mg | CHEWABLE_TABLET | ORAL | Status: DC | PRN
  Administered 2012-07-17 – 2012-07-18 (×2): 80 mg via ORAL

## 2012-07-16 MED ORDER — LACTATED RINGERS IV SOLN
INTRAVENOUS | Status: DC
  Administered 2012-07-16 – 2012-07-17 (×2): via INTRAVENOUS

## 2012-07-16 MED ORDER — DIPHENHYDRAMINE HCL 25 MG PO CAPS: 25.0000 mg | ORAL_CAPSULE | Freq: Four times a day (QID) | ORAL | Status: DC | PRN

## 2012-07-16 MED ORDER — ZOLPIDEM TARTRATE 5 MG PO TABS: 5.0000 mg | ORAL_TABLET | Freq: Every evening | ORAL | Status: DC | PRN

## 2012-07-16 MED ORDER — OXYCODONE-ACETAMINOPHEN 5-325 MG PO TABS
1.0000 | ORAL_TABLET | Freq: Once | ORAL | Status: DC
  Administered 2012-07-16: 1 via ORAL

## 2012-07-16 MED ORDER — MEASLES, MUMPS & RUBELLA VAC ~~LOC~~ INJ: 0.5000 mL | INJECTION | Freq: Once | SUBCUTANEOUS | Status: DC

## 2012-07-16 MED ORDER — FENTANYL CITRATE 0.05 MG/ML IJ SOLN
INTRAMUSCULAR | Status: DC | PRN
  Administered 2012-07-16 (×2): 25 ug via INTRAVENOUS

## 2012-07-16 MED ORDER — PHENYLEPHRINE 40 MCG/ML (10ML) SYRINGE FOR IV PUSH (FOR BLOOD PRESSURE SUPPORT): 80.0000 ug | PREFILLED_SYRINGE | INTRAVENOUS | Status: DC | PRN

## 2012-07-16 MED ORDER — ONDANSETRON HCL 4 MG PO TABS: 4.0000 mg | ORAL_TABLET | ORAL | Status: DC | PRN

## 2012-07-16 MED ORDER — NALBUPHINE SYRINGE 5 MG/0.5 ML: 5.0000 mg | INJECTION | INTRAMUSCULAR | Status: DC | PRN

## 2012-07-16 MED ORDER — OXYTOCIN 10 UNIT/ML IJ SOLN
40.0000 [IU] | INTRAVENOUS | Status: DC | PRN
  Administered 2012-07-16: 40 [IU] via INTRAVENOUS

## 2012-07-16 MED ORDER — LACTATED RINGERS IV SOLN
INTRAVENOUS | Status: DC | PRN
  Administered 2012-07-16 (×2): via INTRAVENOUS

## 2012-07-16 MED ORDER — FENTANYL 2.5 MCG/ML BUPIVACAINE 1/10 % EPIDURAL INFUSION (WH - ANES)
14.0000 mL/h | INTRAMUSCULAR | Status: DC
  Administered 2012-07-16: 5 mL/h via EPIDURAL
  Administered 2012-07-16: 14 mL/h via EPIDURAL

## 2012-07-16 MED ORDER — DOXYLAMINE SUCCINATE (SLEEP) 25 MG PO TABS: 25.0000 mg | ORAL_TABLET | Freq: Every evening | ORAL | Status: DC | PRN

## 2012-07-16 MED ORDER — PROMETHAZINE HCL 25 MG/ML IJ SOLN: 6.2500 mg | INTRAMUSCULAR | Status: DC | PRN

## 2012-07-16 MED ORDER — LACTATED RINGERS IV SOLN: 500.0000 mL | Freq: Once | INTRAVENOUS | Status: DC

## 2012-07-16 MED ORDER — NALBUPHINE HCL 10 MG/ML IJ SOLN: 5.0000 mg | INTRAMUSCULAR | Status: DC | PRN

## 2012-07-16 MED ORDER — SODIUM CHLORIDE 0.9 % IR SOLN
Status: DC | PRN
  Administered 2012-07-16: 1000 mL

## 2012-07-16 MED ORDER — LACTATED RINGERS IV SOLN
INTRAVENOUS | Status: DC
  Administered 2012-07-16 (×2): via INTRAVENOUS

## 2012-07-16 MED ORDER — WITCH HAZEL-GLYCERIN EX PADS: 1.0000 "application " | MEDICATED_PAD | CUTANEOUS | Status: DC | PRN

## 2012-07-16 MED ORDER — KETOROLAC TROMETHAMINE 30 MG/ML IJ SOLN: 30.0000 mg | Freq: Four times a day (QID) | INTRAMUSCULAR | Status: AC | PRN

## 2012-07-16 MED ORDER — ONDANSETRON HCL 4 MG/2ML IJ SOLN
INTRAMUSCULAR | Status: AC
  Filled 2012-07-16: qty 2

## 2012-07-16 MED ORDER — OXYCODONE-ACETAMINOPHEN 5-325 MG PO TABS
1.0000 | ORAL_TABLET | ORAL | Status: DC | PRN
  Administered 2012-07-17 – 2012-07-19 (×7): 2 via ORAL
  Filled 2012-07-16 (×7): qty 2

## 2012-07-16 MED ORDER — ONDANSETRON HCL 4 MG/2ML IJ SOLN: 4.0000 mg | INTRAMUSCULAR | Status: DC | PRN

## 2012-07-16 MED ORDER — LANOLIN HYDROUS EX OINT: 1.0000 "application " | TOPICAL_OINTMENT | CUTANEOUS | Status: DC | PRN

## 2012-07-16 MED ORDER — TERBUTALINE SULFATE 1 MG/ML IJ SOLN: 0.2500 mg | Freq: Once | INTRAMUSCULAR | Status: DC | PRN

## 2012-07-16 MED ORDER — OXYTOCIN BOLUS FROM INFUSION: 500.0000 mL | INTRAVENOUS | Status: DC

## 2012-07-16 MED ORDER — OXYTOCIN 10 UNIT/ML IJ SOLN
INTRAMUSCULAR | Status: AC
  Filled 2012-07-16: qty 4

## 2012-07-16 MED ORDER — MIDAZOLAM HCL 2 MG/2ML IJ SOLN: 0.5000 mg | Freq: Once | INTRAMUSCULAR | Status: DC | PRN

## 2012-07-16 MED ORDER — OXYCODONE-ACETAMINOPHEN 5-325 MG PO TABS
ORAL_TABLET | ORAL | Status: AC
  Administered 2012-07-16: 1 via ORAL
  Filled 2012-07-16: qty 1

## 2012-07-16 MED ORDER — IBUPROFEN 600 MG PO TABS
600.0000 mg | ORAL_TABLET | Freq: Four times a day (QID) | ORAL | Status: DC
  Administered 2012-07-17 – 2012-07-19 (×7): 600 mg via ORAL
  Filled 2012-07-16 (×9): qty 1

## 2012-07-16 MED ORDER — SCOPOLAMINE 1 MG/3DAYS TD PT72: 1.0000 | MEDICATED_PATCH | Freq: Once | TRANSDERMAL | Status: DC

## 2012-07-16 MED ORDER — PROMETHAZINE HCL 25 MG PO TABS: 25.0000 mg | ORAL_TABLET | Freq: Once | ORAL | Status: DC

## 2012-07-16 MED ORDER — IBUPROFEN 600 MG PO TABS: 600.0000 mg | ORAL_TABLET | Freq: Four times a day (QID) | ORAL | Status: DC | PRN

## 2012-07-16 MED ORDER — PRENATAL MULTIVITAMIN CH
1.0000 | ORAL_TABLET | Freq: Every day | ORAL | Status: DC
  Administered 2012-07-17 – 2012-07-18 (×2): 1 via ORAL
  Filled 2012-07-16 (×3): qty 1

## 2012-07-16 MED ORDER — OXYTOCIN 40 UNITS IN LACTATED RINGERS INFUSION - SIMPLE MED
62.5000 mL/h | INTRAVENOUS | Status: DC
  Filled 2012-07-16: qty 1000

## 2012-07-16 MED ORDER — BUPIVACAINE HCL (PF) 0.5 % IJ SOLN
INTRAMUSCULAR | Status: DC | PRN
  Administered 2012-07-16: 20 mL

## 2012-07-16 MED ORDER — ACETAMINOPHEN 10 MG/ML IV SOLN: 1000.0000 mg | Freq: Four times a day (QID) | INTRAVENOUS | Status: AC | PRN

## 2012-07-16 MED ORDER — LACTATED RINGERS IV SOLN
INTRAVENOUS | Status: DC
  Administered 2012-07-16: 18:00:00 via INTRAVENOUS

## 2012-07-16 MED ORDER — OXYTOCIN 40 UNITS IN LACTATED RINGERS INFUSION - SIMPLE MED
1.0000 m[IU]/min | INTRAVENOUS | Status: DC
  Administered 2012-07-16: 2 m[IU]/min via INTRAVENOUS
  Administered 2012-07-16: 4 m[IU]/min via INTRAVENOUS

## 2012-07-16 MED ORDER — CITRIC ACID-SODIUM CITRATE 334-500 MG/5ML PO SOLN
30.0000 mL | ORAL | Status: DC | PRN
  Administered 2012-07-16: 30 mL via ORAL
  Filled 2012-07-16: qty 15

## 2012-07-16 MED ORDER — FENTANYL CITRATE 0.05 MG/ML IJ SOLN: 25.0000 ug | INTRAMUSCULAR | Status: DC | PRN

## 2012-07-16 MED ORDER — OXYCODONE-ACETAMINOPHEN 5-325 MG PO TABS: 1.0000 | ORAL_TABLET | ORAL | Status: DC | PRN

## 2012-07-16 MED ORDER — MORPHINE SULFATE 0.5 MG/ML IJ SOLN
INTRAMUSCULAR | Status: AC
  Filled 2012-07-16: qty 10

## 2012-07-16 MED ORDER — LIDOCAINE-EPINEPHRINE (PF) 2 %-1:200000 IJ SOLN
INTRAMUSCULAR | Status: AC
  Filled 2012-07-16: qty 20

## 2012-07-16 MED ORDER — EPHEDRINE 5 MG/ML INJ: 10.0000 mg | INTRAVENOUS | Status: DC | PRN

## 2012-07-16 MED ORDER — OXYTOCIN 40 UNITS IN LACTATED RINGERS INFUSION - SIMPLE MED: 62.5000 mL/h | INTRAVENOUS | Status: AC

## 2012-07-16 MED ORDER — FENTANYL 2.5 MCG/ML BUPIVACAINE 1/10 % EPIDURAL INFUSION (WH - ANES)
INTRAMUSCULAR | Status: AC
  Filled 2012-07-16: qty 125

## 2012-07-16 MED ORDER — BUPIVACAINE HCL (PF) 0.5 % IJ SOLN
INTRAMUSCULAR | Status: AC
  Filled 2012-07-16: qty 30

## 2012-07-16 MED ORDER — SIMETHICONE 80 MG PO CHEW
80.0000 mg | CHEWABLE_TABLET | Freq: Three times a day (TID) | ORAL | Status: DC
  Administered 2012-07-16 – 2012-07-18 (×7): 80 mg via ORAL

## 2012-07-16 MED ORDER — PHENYLEPHRINE 40 MCG/ML (10ML) SYRINGE FOR IV PUSH (FOR BLOOD PRESSURE SUPPORT)
PREFILLED_SYRINGE | INTRAVENOUS | Status: AC
  Filled 2012-07-16: qty 5

## 2012-07-16 MED ORDER — LACTATED RINGERS IV SOLN: INTRAVENOUS | Status: DC

## 2012-07-16 MED ORDER — LIDOCAINE HCL (PF) 1 % IJ SOLN: 30.0000 mL | INTRAMUSCULAR | Status: DC | PRN

## 2012-07-16 MED ORDER — CEFAZOLIN SODIUM 10 G IJ SOLR
3.0000 g | INTRAMUSCULAR | Status: AC
  Administered 2012-07-16: 3 g via INTRAVENOUS
  Filled 2012-07-16: qty 3000

## 2012-07-16 MED ORDER — PHENYLEPHRINE HCL 10 MG/ML IJ SOLN
INTRAMUSCULAR | Status: DC | PRN
  Administered 2012-07-16: 80 ug via INTRAVENOUS
  Administered 2012-07-16 (×3): 40 ug via INTRAVENOUS
  Administered 2012-07-16: 80 ug via INTRAVENOUS

## 2012-07-16 MED ORDER — MORPHINE SULFATE (PF) 0.5 MG/ML IJ SOLN
INTRAMUSCULAR | Status: DC | PRN
  Administered 2012-07-16: 1 mg via INTRAVENOUS

## 2012-07-16 MED ORDER — ONDANSETRON HCL 4 MG/2ML IJ SOLN
INTRAMUSCULAR | Status: DC | PRN
  Administered 2012-07-16: 4 mg via INTRAVENOUS

## 2012-07-16 MED ORDER — EPHEDRINE SULFATE 50 MG/ML IJ SOLN
INTRAMUSCULAR | Status: DC | PRN
  Administered 2012-07-16 (×5): 10 mg via INTRAVENOUS

## 2012-07-16 MED ORDER — NALOXONE HCL 0.4 MG/ML IJ SOLN: 0.4000 mg | INTRAMUSCULAR | Status: DC | PRN

## 2012-07-16 MED ORDER — KETOROLAC TROMETHAMINE 30 MG/ML IJ SOLN
30.0000 mg | Freq: Four times a day (QID) | INTRAMUSCULAR | Status: AC | PRN
  Administered 2012-07-17 (×2): 30 mg via INTRAVENOUS
  Filled 2012-07-16 (×2): qty 1

## 2012-07-16 MED ORDER — ZOLPIDEM TARTRATE 5 MG PO TABS
ORAL_TABLET | ORAL | Status: AC
  Administered 2012-07-16: 5 mg via ORAL
  Filled 2012-07-16: qty 1

## 2012-07-16 MED ORDER — FENTANYL CITRATE 0.05 MG/ML IJ SOLN
INTRAMUSCULAR | Status: AC
  Filled 2012-07-16: qty 2

## 2012-07-16 MED ORDER — NALOXONE HCL 1 MG/ML IJ SOLN: 1.0000 ug/kg/h | INTRAVENOUS | Status: DC | PRN

## 2012-07-16 MED ORDER — DIPHENHYDRAMINE HCL 25 MG PO CAPS: 25.0000 mg | ORAL_CAPSULE | ORAL | Status: DC | PRN

## 2012-07-16 MED ORDER — MEPERIDINE HCL 25 MG/ML IJ SOLN: 6.2500 mg | INTRAMUSCULAR | Status: DC | PRN

## 2012-07-16 MED ORDER — DIPHENHYDRAMINE HCL 50 MG/ML IJ SOLN: 25.0000 mg | INTRAMUSCULAR | Status: DC | PRN

## 2012-07-16 MED ORDER — TETANUS-DIPHTH-ACELL PERTUSSIS 5-2.5-18.5 LF-MCG/0.5 IM SUSP: 0.5000 mL | Freq: Once | INTRAMUSCULAR | Status: DC

## 2012-07-16 MED ORDER — EPHEDRINE 5 MG/ML INJ
INTRAVENOUS | Status: AC
  Filled 2012-07-16: qty 4

## 2012-07-16 MED ORDER — SODIUM BICARBONATE 8.4 % IV SOLN
INTRAVENOUS | Status: AC
  Filled 2012-07-16: qty 50

## 2012-07-16 MED ORDER — SODIUM BICARBONATE 8.4 % IV SOLN
INTRAVENOUS | Status: DC | PRN
  Administered 2012-07-16: 5 mL via EPIDURAL

## 2012-07-16 MED ORDER — METOCLOPRAMIDE HCL 5 MG/ML IJ SOLN: 10.0000 mg | Freq: Three times a day (TID) | INTRAMUSCULAR | Status: DC | PRN

## 2012-07-16 MED ORDER — LACTATED RINGERS IV SOLN
500.0000 mL | INTRAVENOUS | Status: DC | PRN
  Administered 2012-07-16 (×2): 500 mL via INTRAVENOUS

## 2012-07-16 MED ORDER — DIBUCAINE 1 % RE OINT: 1.0000 "application " | TOPICAL_OINTMENT | RECTAL | Status: DC | PRN

## 2012-07-16 MED FILL — Oxycodone w/ Acetaminophen Tab 5-325 MG: ORAL | Qty: 1 | Status: AC

## 2012-07-16 MED FILL — Zolpidem Tartrate Tab 5 MG: ORAL | Qty: 1 | Status: AC

## 2012-07-16 SURGICAL SUPPLY — 45 items
APL SKNCLS STERI-STRIP NONHPOA (GAUZE/BANDAGES/DRESSINGS) ×1
BENZOIN TINCTURE PRP APPL 2/3 (GAUZE/BANDAGES/DRESSINGS) ×2 IMPLANT
BINDER ABD UNIV 10 28-50 (GAUZE/BANDAGES/DRESSINGS) ×1 IMPLANT
BINDER ABD UNIV 12 45-62 (WOUND CARE) IMPLANT
BINDER ABDOM UNIV 10 (GAUZE/BANDAGES/DRESSINGS) ×2
BINDER ABDOMINAL 46IN 62IN (WOUND CARE)
CLOTH BEACON ORANGE TIMEOUT ST (SAFETY) ×2 IMPLANT
DRAPE LG THREE QUARTER DISP (DRAPES) ×2 IMPLANT
DRESSING TELFA 8X3 (GAUZE/BANDAGES/DRESSINGS) ×2 IMPLANT
DRSG OPSITE 11X17.75 LRG (GAUZE/BANDAGES/DRESSINGS) ×1 IMPLANT
DRSG OPSITE POSTOP 4X10 (GAUZE/BANDAGES/DRESSINGS) ×2 IMPLANT
DURAPREP 26ML APPLICATOR (WOUND CARE) ×2 IMPLANT
ELECT REM PT RETURN 9FT ADLT (ELECTROSURGICAL) ×2
ELECTRODE REM PT RTRN 9FT ADLT (ELECTROSURGICAL) ×1 IMPLANT
EXTRACTOR VACUUM M CUP 4 TUBE (SUCTIONS) IMPLANT
GLOVE BIO SURGEON STRL SZ7 (GLOVE) ×2 IMPLANT
GLOVE BIOGEL PI IND STRL 7.0 (GLOVE) ×2 IMPLANT
GLOVE BIOGEL PI INDICATOR 7.0 (GLOVE) ×3
GOWN PREVENTION PLUS LG XLONG (DISPOSABLE) ×2 IMPLANT
GOWN STRL REIN XL XLG (GOWN DISPOSABLE) ×2 IMPLANT
KIT ABG SYR 3ML LUER SLIP (SYRINGE) IMPLANT
NDL HYPO 25X5/8 SAFETYGLIDE (NEEDLE) IMPLANT
NEEDLE HYPO 22GX1.5 SAFETY (NEEDLE) ×2 IMPLANT
NEEDLE HYPO 25X5/8 SAFETYGLIDE (NEEDLE) IMPLANT
NS IRRIG 1000ML POUR BTL (IV SOLUTION) ×2 IMPLANT
PACK C SECTION WH (CUSTOM PROCEDURE TRAY) ×2 IMPLANT
PAD ABD 7.5X8 STRL (GAUZE/BANDAGES/DRESSINGS) ×3 IMPLANT
PAD OB MATERNITY 4.3X12.25 (PERSONAL CARE ITEMS) ×2 IMPLANT
RTRCTR C-SECT PINK 25CM LRG (MISCELLANEOUS) IMPLANT
SLEEVE SCD COMPRESS KNEE MED (MISCELLANEOUS) IMPLANT
SPONGE SURGIFOAM ABS GEL 12-7 (HEMOSTASIS) IMPLANT
STAPLER VISISTAT 35W (STAPLE) IMPLANT
STRIP CLOSURE SKIN 1/2X4 (GAUZE/BANDAGES/DRESSINGS) ×2 IMPLANT
SUT PDS AB 0 CTX 60 (SUTURE) ×1 IMPLANT
SUT PLAIN 0 NONE (SUTURE) IMPLANT
SUT SILK 0 TIES 10X30 (SUTURE) IMPLANT
SUT VIC AB 0 CT1 36 (SUTURE) ×6 IMPLANT
SUT VIC AB 3-0 CT1 27 (SUTURE) ×2
SUT VIC AB 3-0 CT1 TAPERPNT 27 (SUTURE) ×1 IMPLANT
SUT VIC AB 4-0 KS 27 (SUTURE) ×1 IMPLANT
SYR CONTROL 10ML LL (SYRINGE) ×2 IMPLANT
TAPE CLOTH SURG 4X10 WHT LF (GAUZE/BANDAGES/DRESSINGS) ×1 IMPLANT
TOWEL OR 17X24 6PK STRL BLUE (TOWEL DISPOSABLE) ×6 IMPLANT
TRAY FOLEY CATH 14FR (SET/KITS/TRAYS/PACK) ×1 IMPLANT
WATER STERILE IRR 1000ML POUR (IV SOLUTION) ×2 IMPLANT

## 2012-07-16 NOTE — ED Notes (Signed)
I have seen and examined this patient and I agree with the above. Cam Hai 8:51 AM 07/16/2012

## 2012-07-16 NOTE — H&P (Signed)
Joan Moore is a 22 y.o. G2P1001 at 68.1 wga by [place] with h/o previous C-section who presented to MAU for contractions and evaluation of loss of fluid.  Amnisure and fern were negative. Patient was found to be contracting every 3-4 minutes. She was found to have elevated BP's and had negative PIH workup in MAU: Pr/Cr:0.18, AST/ALT: 15/9, Cr: 0.57., platelet: 268. She was about to be discharged home for latent labor and on discharge had persistently elevated BP's in the 160's/105 at which point she was admitted on L&D for labor management in setting of elevated BP and TOLAC.   Current pregnancy course:  [facility] by [facility] for history of pre-eclampsia in last pregnancy. last visit: 05/19/12, with normal 24hr urine and [place].  TOLAC with consent obtained History of c-section in previous pregnancy for non-reassuring fetal heart tones. Patient had been induced for post dates and was also found to have pre-eclampsia.   History OB History   Grav Para Term Preterm Abortions TAB SAB Ect Mult Living   2 1 1  0 0 0 0 0 0 1     Past Medical History  Diagnosis Date  . Hypertension   . Bipolar 1 disorder   . Headache    Past Surgical History  Procedure Laterality Date  . Cesarean section     Family History: family history includes Diabetes in her maternal grandfather, maternal grandmother, and mother and Hypertension in her maternal grandfather, maternal grandmother, and mother.  There is no history of Other. Social History:  reports that she has quit smoking. Her smoking use included Cigarettes. She smoked 0.30 packs per day. She has never used smokeless tobacco. She reports that she does not drink alcohol or use illicit drugs.   Prenatal Transfer Tool  Maternal Diabetes: No Genetic Screening: Declined, not available Maternal Ultrasounds/Referrals: Normal Fetal Ultrasounds or other Referrals:  BPP on 07/16/12 normal Maternal Substance Abuse:  No Significant Maternal Medications:   None Significant Maternal Lab Results:  Lab values include: Group B Strep negative Other Comments:  None  ROS Negative except per HPI Dilation: 3 Effacement (%): 50 Exam by:: Dr Gwenlyn Saran Blood pressure 130/80, pulse 108, temperature 98.5 F (36.9 C), temperature source Oral, resp. rate 22, height 5\' 2"  (1.575 m), weight 334 lb (151.501 kg), last menstrual period 10/22/2011, SpO2 99.00%. Exam Physical Exam  Physical Examination: General appearance - alert, obese, uncomfortable through contractions.  Chest - clear to auscultation, no wheezes, rales or rhonchi, symmetric air entry Heart - normal rate, regular rhythm, normal S1, S2, no murmurs, rubs, clicks or gallops Abdomen - gravid, non tender.  Extremities - trace pedal edema  FHT: 150's, moderate varaibility, accel present, variable and early decels present Cntx: irregular: every 4-8 minutes.   Prenatal labs: ABO, Rh: B/POS/-- (11/06 0953) Antibody: NEG (11/06 0953) Rubella: 166.5 (11/06 0953) RPR: NON REAC (11/06 0953)  HBsAg: NEGATIVE (11/06 0953)  HIV: NON REACTIVE (11/06 0953)  GBS: Negative (02/07 0000)   Assessment/Plan: Joan Moore is a 22 y.o. G2P1001 at 18.1 wga by [place] with h/o previous C-section admitted to L&D for labor management in setting of elevated BP and TOLAC. - elevated BP: PIH workup negative. Continue to monitor for signs of headache, change in vision, at which point, would treat for pre-eclampsia - expectant management - pain management: nubain 5mg  - GBS negative - fetal well being: category 2, with normal BPP and AFI.  - TOLAC: consent papers in chart.    Marena Chancy 07/16/2012,  6:42 AM  I have seen and examined this patient and I agree with the above.  Cam Hai 8:43 AM 07/16/2012

## 2012-07-16 NOTE — Progress Notes (Signed)
[id number] fhr 146 intermittent, 2nd RN came to help so I could assist with an epidural in my other pt's room. Plan to try [facility] on this pt as she is good candidate.

## 2012-07-16 NOTE — Progress Notes (Signed)
1610 pt removed [place] and cardio monitor to go to bathroom and sit for comfort. Entered room several times to check on patient and also to hurry pt to have pt to return pt to bed to place pt back on efm. At 0808 pt returned to bed and cardio and toco placed on pt and searching for fhr. Cardio found on top of monitor plugged in with gel facing up and had picked up random sounds that traced between [phone] that did not belong to pt.

## 2012-07-16 NOTE — Brief Op Note (Signed)
07/15/2012 - 07/16/2012  4:25 PM  PATIENT:  Joan Moore  22 y.o. female  PRE-OPERATIVE DIAGNOSIS:  failed induction/previous cesarean section  POST-OPERATIVE DIAGNOSIS:  failed induction/previous cesarean section  PROCEDURE:  Procedure(s): Repeat cesarean section with delivery of baby  boy at 77. Apgars 7/9. (N/A)  SURGEON:  Surgeon(s) and Role:    * Willodean Rosenthal, MD - Primary  ASSISTANTS: Joellyn Haff, CNM  ANESTHESIA:   spinal  EBL:  Total I/O In: 1600 [I.V.:1600] Out: 1015 [Urine:165; Blood:850]  BLOOD ADMINISTERED:none  DRAINS: none   LOCAL MEDICATIONS USED:  MARCAINE     SPECIMEN:  No Specimen  DISPOSITION OF SPECIMEN:  labor and delivery  COUNTS:  YES  TOURNIQUET:  * No tourniquets in log *  DICTATION: .Note written in EPIC  PLAN OF CARE: Admit to inpatient   PATIENT DISPOSITION:  PACU - hemodynamically stable.   Delay start of Pharmacological VTE agent (>24hrs) due to surgical blood loss or risk of bleeding: yes

## 2012-07-16 NOTE — Progress Notes (Signed)
Joan Moore is a 22 y.o. G2P1001 at [redacted]w[redacted]d admitted for augmentation of early labor d/t elevated BP's. Desires TOLAC  Subjective: Uncomfortable w/ uc's, requesting epidural. Denies constant abdominal pain. Denies ha, scotomata, ruq/epigastric pain.   Reports some vomiting earlier.  Objective: BP 128/77  Pulse 122  Temp(Src) 98.7 F (37.1 C) (Oral)  Resp 20  Ht 5\' 2"  (1.575 m)  Wt 151.501 kg (334 lb)  BMI 61.07 kg/m2  SpO2 99%  LMP 10/22/2011      FHT:  FHR: 155 bpm, variability: moderate,  accelerations:  Abscent,  decelerations:  Present occasional variable UC:   irregular, every 1-8 minutes SVE:   Dilation: 5 Effacement (%): 50 Station: -3 Exam by:: Rzhang,rnc-ob  Abdomen soft and non-tender  Labs: Lab Results  Component Value Date   WBC 9.0 07/16/2012   HGB 12.1 07/16/2012   HCT 37.3 07/16/2012   MCV 82.2 07/16/2012   PLT 240 07/16/2012    Assessment / Plan: TOLAC augmentation of labor, currently on 2mu/min pitocin, progressing well- RN getting pt prepared for epidural  Labor: Progressing normally Preeclampsia:  labs stable Fetal Wellbeing:  Category II Pain Control:  IV pain meds, waiting on epidural I/D:  n/a Anticipated MOD:  VBAC  Marge Duncans 07/16/2012, 11:44 AM

## 2012-07-16 NOTE — Progress Notes (Signed)
Joan Moore is a 22 y.o. G2P1001 at [redacted]w[redacted]d admitted for augmentation of early labor d/t elevated bp's  Subjective: Comfortable w/ epidural. No complaints.  Denies ha, scotomata, ruq/epigastric pain, n/v.    Objective: BP 116/54  Pulse 118  Temp(Src) 98.7 F (37.1 C) (Oral)  Resp 18  Ht 5\' 2"  (1.575 m)  Wt 151.501 kg (334 lb)  BMI 61.07 kg/m2  SpO2 95%  LMP 10/22/2011      FHT:  FHR: 150 bpm, variability: moderate,  accelerations:  Present,  decelerations:  Present variables UC:   irregular, every 2-5 minutes SVE:   Dilation: 6 Effacement (%): 50 Station: -1;-2 Exam by:: kbooker,cnm bbow, pos scalp stim AROM mod amount clear fluid IUPC placed w/o difficulty  Labs: Lab Results  Component Value Date   WBC 9.0 07/16/2012   HGB 12.1 07/16/2012   HCT 37.3 07/16/2012   MCV 82.2 07/16/2012   PLT 240 07/16/2012    Assessment / Plan: TOLAC Augmentation of labor, on pitocin 65mu/min currently, progressing normally  Labor: Progressing normally Preeclampsia:  n/a Fetal Wellbeing:  Category II Pain Control:  Epidural I/D:  n/a Anticipated MOD:  VBAC  Marge Duncans 07/16/2012, 1:38 PM

## 2012-07-16 NOTE — Op Note (Signed)
Dezhane Ethelene Browns PROCEDURE DATE: 07/15/2012 - 07/16/2012  PREOPERATIVE DIAGNOSIS: Intrauterine pregnancy at  [redacted]w[redacted]d weeks gestation; cephalo-pelvic disproportion, failed induction, failure to progress: arrest of descent and non-reassuring fetal status  POSTOPERATIVE DIAGNOSIS: The same  PROCEDURE:Repeat Low Transverse Cesarean Section  SURGEON:  Dr. Eber Jones L. Harraway-Smith  ASSISTANT: Shawna Clamp, CNM   INDICATIONS: Joan Moore is a 22 y.o. G2P2002 at [redacted]w[redacted]d here for cesarean section secondary to the indications listed under preoperative diagnosis; please see preoperative note for further details.  The risks of cesarean section were discussed with the patient including but were not limited to: bleeding which may require transfusion or reoperation; infection which may require antibiotics; injury to bowel, bladder, ureters or other surrounding organs; injury to the fetus; need for additional procedures including hysterectomy in the event of a life-threatening hemorrhage; placental abnormalities wth subsequent pregnancies, incisional problems, thromboembolic phenomenon and other postoperative/anesthesia complications.   The patient concurred with the proposed plan, giving informed written consent for the procedure.    FINDINGS:  Viable female infant in cephalic presentation.  Apgars 7 and 9.  Clear amniotic fluid.  Intact placenta, three vessel cord.  Normal uterus, fallopian tubes and ovaries bilaterally.  ANESTHESIA: Spinal/Epidural INTRAVENOUS FLUIDS: 1600 ml ESTIMATED BLOOD LOSS: 850 ml SPECIMENS: L&D COMPLICATIONS: None immediate  PROCEDURE IN DETAIL:  The patient preoperatively received intravenous antibiotics and had sequential compression devices applied to her lower extremities.  She was then taken to the operating room where spinal anesthesia was administered and was found to be adequate. She was then placed in a dorsal supine position with a leftward tilt, and prepped and draped in a  sterile manner.  A foley catheter was placed into her bladder and attached to constant gravity.  After an adequate timeout was performed, a Pfannenstiel skin incision was made with scalpel and carried through to the underlying layer of fascia. The fascia was incised in the midline, and this incision was extended bilaterally using the Mayo scissors.  Kocher clamps were applied to the superior aspect of the fascial incision and the underlying rectus muscles were dissected off bluntly. A similar process was carried out on the inferior aspect of the fascial incision. The rectus muscles were separated in the midline bluntly and the peritoneum was entered bluntly. Attention was turned to the lower uterine segment where a low transverse hysterotomy incision was made with a scalpel and extended bilaterally bluntly.  The infant was successfully delivered, the cord was clamped and cut and the infant was handed over to awaiting neonatology team. Uterine massage was then administered, and the placenta delivered intact with a three-vessel cord. The uterus was then cleared of clot and debris.  The hysterotomy was closed with 0 Vicryl in a running locked fashion, and an imbricating layer was also placed with the same suture. The uterus was returned to the pelvis. The pelvis was cleared of all clot and debris. Hemostasis was confirmed on all surfaces.  The peritoneum and the muscles were reapproximated using 0Vicryl in 1 interrupted suture. The fascia was then closed using 1 PDS in a running fashion.  The subcutaneous layer was irrigated, then reapproximated with 3-0 vicryl in a running locked fashion, and the skin was closed with a 4-0 Vicryl subcuticular stitch. 20cc of 0.5% Marcaine was injected into the skin.  Benzoin and Steri-strips were applied.   The patient tolerated the procedure well. Sponge, lap, instrument and needle counts were correct x 2.  She was taken to the recovery room in  stable condition.

## 2012-07-16 NOTE — Progress Notes (Signed)
Patient ID: Joan Moore, female   DOB: [birth year], 22 y.o.   MRN: [id number] FHR: 155, min-mod variability, pos accels, variables w/ each uc to nadir of 90-105 UCs via IUPC: q 2-5, mvu's 160-205  Will initiate amnioinfusion w/ LR bolus then 158ml/hr Monitor closely for return   Cheral Marker, CNM, WHNP-BC

## 2012-07-16 NOTE — Anesthesia Postprocedure Evaluation (Signed)
  Anesthesia Post Note  Patient: Joan Moore  Procedure(s) Performed: Procedure(s) (LRB): Repeat cesarean section with delivery of baby  boy at 60. Apgars 7/9. (N/A)  Anesthesia type: Epidural  Patient location: PACU  Post pain: Pain level controlled  Post assessment: Post-op Vital signs reviewed  Last Vitals:  Filed Vitals:   07/16/12 1700  BP: 143/60  Pulse: 121  Temp:   Resp: 20    Post vital signs: Reviewed  Level of consciousness: awake  Complications: No apparent anesthesia complications

## 2012-07-16 NOTE — Transfer of Care (Signed)
Immediate Anesthesia Transfer of Care Note  Patient: Joan Moore  Procedure(s) Performed: Procedure(s): Repeat cesarean section with delivery of baby  boy at 74. Apgars 7/9. (N/A)  Patient Location: PACU  Anesthesia Type:Epidural  Level of Consciousness: awake, alert  and oriented  Airway & Oxygen Therapy: Patient Spontanous Breathing  Post-op Assessment: Report given to PACU RN and Post -op Vital signs reviewed and stable  Post vital signs: Reviewed and stable  Complications: No apparent anesthesia complications

## 2012-07-16 NOTE — OR Nursing (Addendum)
Uterus massaged by S. Gwenette Wellons Charity fundraiser. Two tubes of cord blood sent to lab.  Foley catheter in place upon arrival to OR. Urine color-yellow. 100cc of blood evcauated from uterus during uterine massage.

## 2012-07-16 NOTE — Anesthesia Procedure Notes (Signed)

## 2012-07-16 NOTE — Anesthesia Preprocedure Evaluation (Signed)
Anesthesia Evaluation  Patient identified by MRN, date of birth, ID band Patient awake    Reviewed: Allergy & Precautions, H&P , Patient's Chart, lab work & pertinent test results  Airway Mallampati: II TM Distance: >3 FB Neck ROM: full    Dental no notable dental hx.    Pulmonary neg pulmonary ROS,  breath sounds clear to auscultation  Pulmonary exam normal       Cardiovascular hypertension, negative cardio ROS  Rhythm:regular Rate:Normal     Neuro/Psych  Headaches, PSYCHIATRIC DISORDERS negative neurological ROS  negative psych ROS   GI/Hepatic negative GI ROS, Neg liver ROS,   Endo/Other  negative endocrine ROSMorbid obesity  Renal/GU negative Renal ROS     Musculoskeletal   Abdominal   Peds  Hematology negative hematology ROS (+)   Anesthesia Other Findings   Reproductive/Obstetrics (+) Pregnancy                           Anesthesia Physical Anesthesia Plan  ASA: III  Anesthesia Plan: Epidural   Post-op Pain Management:    Induction:   Airway Management Planned:   Additional Equipment:   Intra-op Plan:   Post-operative Plan:   Informed Consent: I have reviewed the patients History and Physical, chart, labs and discussed the procedure including the risks, benefits and alternatives for the proposed anesthesia with the patient or authorized representative who has indicated his/her understanding and acceptance.     Plan Discussed with:   Anesthesia Plan Comments:         Anesthesia Quick Evaluation

## 2012-07-17 LAB — CBC
HCT: 30.6 % — ABNORMAL LOW (ref 36.0–46.0)
Hemoglobin: 10 g/dL — ABNORMAL LOW (ref 12.0–15.0)
MCH: 27 pg (ref 26.0–34.0)
RBC: 3.71 MIL/uL — ABNORMAL LOW (ref 3.87–5.11)

## 2012-07-17 NOTE — Progress Notes (Signed)
Nt in room .   Patient had abcess under L armpit, "popped " while nt in room.  T/s MD notified.    NT states abcess full of pus and blood.   Bacitracin to area.  No swelling or redness noted @ site.  Pt instructed to call if area becomes swollen or painful.   Spero Geralds RN

## 2012-07-17 NOTE — Anesthesia Postprocedure Evaluation (Signed)
  Anesthesia Post Note  Patient: Joan Moore  Procedure(s) Performed: Procedure(s) (LRB): Repeat cesarean section with delivery of baby  boy at 60. Apgars 7/9. (N/A)  Anesthesia type: Epidural  Patient location: Mother/Baby  Post pain: Pain level controlled  Post assessment: Post-op Vital signs reviewed  Last Vitals:  Filed Vitals:   07/17/12 0500  BP: 100/68  Pulse: 112  Temp: 37 C  Resp: 20    Post vital signs: Reviewed  Level of consciousness:alert  Complications: No apparent anesthesia complications

## 2012-07-17 NOTE — Progress Notes (Signed)
Subjective: Postpartum Day 1: failed TOLAC- RLTCesarean Delivery Eating, drinking, ambulating well.  +flatus.  Lochia and pain wnl.  No complaints.  Still has foley.  Objective: Vital signs in last 24 hours: Temp:  [97 F (36.1 C)-99.7 F (37.6 C)] 98.6 F (37 C) (02/09 0500) Pulse Rate:  [90-142] 112 (02/09 0500) Resp:  [18-22] 20 (02/09 0500) BP: (94-153)/(35-112) 100/68 mmHg (02/09 0500) SpO2:  [89 %-100 %] 98 % (02/09 0500)  Physical Exam:  General: alert, cooperative and no distress Lochia: appropriate Uterine Fundus: firm Incision: no significant drainage, dressing still intact DVT Evaluation: No evidence of DVT seen on physical exam. Negative Homan's sign. No cords or calf tenderness. No significant calf/ankle edema. Bilateral knee-high SCDs in place  Recent Labs  07/16/12 1723 07/17/12 0540  HGB 11.0* 10.0*  HCT 33.6* 30.6*    Assessment/Plan: Status post Cesarean section. Doing well postoperatively.  Continue current care. Breast/bottlefeeding- lactation consult.  Undecided about contraception. Wants IP circumcision.   Marge Duncans 07/17/2012, 7:09 AM

## 2012-07-17 NOTE — Clinical Social Work Maternal (Signed)
    Clinical Social Work Department PSYCHOSOCIAL ASSESSMENT - MATERNAL/CHILD 07/17/2012  Patient:  TOYIA, JELINEK  Account Number:  [id number]  Admit Date:  07/15/2012  Marjo Bicker Name:   Troy Sine    Clinical Social Worker:  Lulu Riding, LCSW   Date/Time:  07/17/2012 09:30 AM  Date Referred:  07/17/2012   Referral source  CN     Referred reason  Behavioral Health Issues   Other referral source:    I:  FAMILY / HOME ENVIRONMENT Child's legal guardian:  PARENT  Guardian - Name Guardian - Age Guardian - Address  Kaziah Krizek [address] [place], [place] [address]  FOB not very involved     Other household support members/support persons Name Relationship DOB   MOTHER    SISTER 13  Josad SON 2   Other support:   Uncle, MOB's grandmother    II  PSYCHOSOCIAL DATA Information Source:  Patient Interview  Event organiser Employment:   N/A   Surveyor, quantity resources:  Media planner If [employer] - County:  [employer] / Grade:   Maternity Care Coordinator / Child Services Coordination / Early Interventions:  Cultural issues impacting care:   None identified    III  STRENGTHS Strengths  Adequate Resources  Compliance with medical plan  Home prepared for Child (including basic supplies)  Other - See comment  Supportive family/friends   Strength comment:  CSW provided MOB with a pediatrician list as she states she would like to find a new doctor for her children.  She states she was seeing a family practice doctor who has moved away.   IV  RISK FACTORS AND CURRENT PROBLEMS Current Problem:  YES   Risk Factor & Current Problem Patient Issue Family Issue Risk Factor / Current Problem Comment  Mental Illness Y N MOB-Bipolar I    V  SOCIAL WORK ASSESSMENT CSW met with MOB in her first floor room/101 to complete assessment for hx of Bipolar I Disorder.  MOB was pleasant and welcomed CSW into her room.  Her affect was somewhat flat.  She seemed  open with CSW.  She reports having everything she needs for baby at home and a good support system.  She states her uncle is caring for her 55 year old while she is in the hospital.  She reports being diagnosed with Bipolar Disorder in 2008.  She initially took Lithium, but then changed to Abilify.  She states she stopped medication with +UPT.  She states her symptoms are typically more depression than mania.  She is interested in getting back on medication, however, states she does not think Abilify worked well for her.  She does not have a psychiatrist currently and is not interested in going back to the doctor she most recently received care from.  CSW looked up psychiatrists in the area who take [employer] and provided MOB with a list.  MOB was very appreciative and states no need for assistance in making an appointment.  Bonding with baby is evident.      VI SOCIAL WORK PLAN Social Work Plan  No Further Intervention Required / No Barriers to Discharge   Type of pt/family education:   If child protective services report - county:   If child protective services report - date:   Information/referral to community resources comment:   Medical illustrator   Other social work plan:

## 2012-07-18 ENCOUNTER — Encounter (HOSPITAL_COMMUNITY): Payer: Self-pay | Admitting: Obstetrics & Gynecology

## 2012-07-18 MED ORDER — PNEUMOCOCCAL VAC POLYVALENT 25 MCG/0.5ML IJ INJ
0.5000 mL | INJECTION | INTRAMUSCULAR | Status: AC
  Administered 2012-07-18: 0.5 mL via INTRAMUSCULAR
  Filled 2012-07-18: qty 0.5

## 2012-07-18 NOTE — Progress Notes (Signed)
Subjective: Postpartum Day 2: Cesarean Delivery Patient reports incisional pain, tolerating PO, + flatus, + BM and no problems voiding. Patient reports her last few blood pressures have been good. She is getting up and walking around more this morning to decrease the fluid in her legs. Tolerating being up and moving well.   Objective: Vital signs in last 24 hours: Temp:  [98 F (36.7 C)-98.3 F (36.8 C)] 98.1 F (36.7 C) (02/10 0525) Pulse Rate:  [105-109] 109 (02/10 0525) Resp:  [18-20] 18 (02/10 0525) BP: (121-136)/(65-74) 136/65 mmHg (02/10 0525) SpO2:  [99 %] 99 % (02/09 1404)  Physical Exam:  General: alert, cooperative and no distress Lochia: appropriate Uterine Fundus: firm Incision: healing well, no significant erythema DVT Evaluation: No evidence of DVT seen on physical exam. Negative Homan's sign. No cords or calf tenderness. No significant calf/ankle edema.   Recent Labs  07/16/12 1723 07/17/12 0540  HGB 11.0* 10.0*  HCT 33.6* 30.6*    Assessment/Plan: Status post Cesarean section. Doing well postoperatively.  Continue current care. Patient is breastfeeding and bottle-feeding. Patient plans to get the implanon at [facility].  Adela Glimpse 07/18/2012, 7:41 AM

## 2012-07-18 NOTE — Progress Notes (Signed)
I have seen the patient with the resident/student and agree with the above.  Joan Moore  

## 2012-07-19 ENCOUNTER — Encounter: Payer: 59 | Admitting: Obstetrics & Gynecology

## 2012-07-19 MED ORDER — IBUPROFEN 600 MG PO TABS: 600.0000 mg | ORAL_TABLET | Freq: Four times a day (QID) | ORAL | Status: DC

## 2012-07-19 MED ORDER — OXYCODONE-ACETAMINOPHEN 5-325 MG PO TABS: 1.0000 | ORAL_TABLET | ORAL | Status: DC | PRN

## 2012-07-19 NOTE — Progress Notes (Signed)
Post discharge chart review completed.  

## 2012-07-19 NOTE — Discharge Summary (Signed)
Joan Moore is a 22 y.o. G2P1001 at 64.1 wga by [place] with h/o previous C-section who presented to MAU for contractions and evaluation of loss of fluid, and was found to be in active labor.  Pt was given TOLAC but was unsuccessful due to CPD and nonreasuring FHT.  A viable female infant was delivered with Apgars of 7/9, placenta was delivered intact with three vessel cord and EBL was 800.  Pt would like to breast feed (lactation was consulted twice during stay), and desires an IUD for contraception.  Pt does have a history of CTHN, pressures were between 120-153 systolic before d/c.  She was sent home on HCTZ and will need to be followed.    Obstetric Discharge Summary Reason for Admission: onset of labor Prenatal Procedures: none Intrapartum Procedures: cesarean: low cervical, transverse Postpartum Procedures: none Complications-Operative and Postpartum: none Hemoglobin  Date Value Range Status  07/17/2012 10.0* 12.0 - 15.0 g/dL Final     HCT  Date Value Range Status  07/17/2012 30.6* 36.0 - 46.0 % Final    Physical Exam:  General: alert, cooperative and appears stated age 3: appropriate Uterine Fundus: firm Incision: Adhesive intact DVT Evaluation: No evidence of DVT seen on physical exam.  Discharge Diagnoses: Term Pregnancy-delivered  Discharge Information: Date: 07/19/2012 Activity: pelvic rest Diet: routine Medications: Ibuprofen and Percocet Condition: stable Instructions: refer to practice specific booklet Discharge to: home   Newborn Data: Live born female  Birth Weight: 9 lb 13.1 oz (4455 g) APGAR: 7, 9  Home with mother.  Joan First Paulina Fusi, DO of [facility] 07/19/2012, 7:44 AM

## 2012-07-22 ENCOUNTER — Telehealth: Payer: Self-pay | Admitting: Obstetrics & Gynecology

## 2012-07-22 ENCOUNTER — Encounter (HOSPITAL_COMMUNITY): Payer: Self-pay | Admitting: *Deleted

## 2012-07-22 ENCOUNTER — Inpatient Hospital Stay (HOSPITAL_COMMUNITY)
Admission: AD | Admit: 2012-07-22 | Discharge: 2012-07-22 | Disposition: A | Discharge status: Home / Self Care | Payer: 59 | Source: Ambulatory Visit | Attending: Obstetrics & Gynecology | Admitting: Obstetrics & Gynecology

## 2012-07-22 DIAGNOSIS — R0602 Shortness of breath: Secondary | ICD-10-CM | POA: Insufficient documentation

## 2012-07-22 DIAGNOSIS — O10919 Unspecified pre-existing hypertension complicating pregnancy, unspecified trimester: Secondary | ICD-10-CM

## 2012-07-22 DIAGNOSIS — O09299 Supervision of pregnancy with other poor reproductive or obstetric history, unspecified trimester: Secondary | ICD-10-CM | POA: Insufficient documentation

## 2012-07-22 DIAGNOSIS — IMO0001 Reserved for inherently not codable concepts without codable children: Secondary | ICD-10-CM | POA: Insufficient documentation

## 2012-07-22 DIAGNOSIS — I10 Essential (primary) hypertension: Secondary | ICD-10-CM

## 2012-07-22 LAB — COMPREHENSIVE METABOLIC PANEL
Alkaline Phosphatase: 91 U/L (ref 39–117)
BUN: 12 mg/dL (ref 6–23)
Chloride: 107 mEq/L (ref 96–112)
GFR calc Af Amer: >90 mL/min (ref >90)
GFR calc non Af Amer: >90 mL/min (ref >90)
Glucose, Bld: 68 mg/dL — ABNORMAL LOW (ref 70–99)
Potassium: 4.1 mEq/L (ref 3.5–5.1)
Total Bilirubin: 0.3 mg/dL (ref 0.3–1.2)

## 2012-07-22 LAB — PROTEIN / CREATININE RATIO, URINE: Creatinine, Urine: 491.76 mg/dL

## 2012-07-22 LAB — URINE MICROSCOPIC-ADD ON

## 2012-07-22 LAB — URINALYSIS, ROUTINE W REFLEX MICROSCOPIC
Glucose, UA: NEGATIVE mg/dL
Ketones, ur: 15 mg/dL — AB
Leukocytes, UA: NEGATIVE
pH: 6 (ref 5.0–8.0)

## 2012-07-22 LAB — CBC
HCT: 30.9 % — ABNORMAL LOW (ref 36.0–46.0)
Hemoglobin: 10 g/dL — ABNORMAL LOW (ref 12.0–15.0)
WBC: 8.1 10*3/uL (ref 4.0–10.5)

## 2012-07-22 MED ORDER — HYDROCHLOROTHIAZIDE 12.5 MG PO CAPS: 12.5000 mg | ORAL_CAPSULE | Freq: Every day | ORAL | Status: DC

## 2012-07-22 NOTE — Telephone Encounter (Signed)
Phone Call Documentation 07/22/2012 4:23 PM  Home Health RN called to inform me that this patient who delivered on 07/16/12 reports SOB with exertion, headaches, blurry vision and her BP was in the 160s/100s.  RN was advised to tell the patient to come in to MAU immediately for evaluation; concerned about preeclampsia, PE or other cardiopulmonary condition. MAU notified.  Jaynie Collins, MD, FACOG Attending Obstetrician & Gynecologist Faculty Practice, [facility]

## 2012-07-22 NOTE — MAU Note (Signed)
Post partum  2/8 via c-section. Stated her legs have been swollen since delivery. More swollen today. Felt SOB at home and had some chest pain and seeing spots. Pt reports had elevated b/p with this pregnancy. Seen at [facility] for prenatal care.

## 2012-07-22 NOTE — MAU Provider Note (Signed)
History     CSN: [id number]  Arrival date and time: 07/22/12 1754   None     Chief Complaint  Patient presents with  . Shortness of Breath  . Leg Swelling   HPI This is a 22 y.o. female who presents with c/o feeling like she "can't take a deep breath".  This morning had a headache and visual changes but those are gone now. Slight cough. No fever. States swelling is the same as it was at delivery, no worse. Breastfeeding.   RN Note: Post partum 2/8 via c-section. Stated her legs have been swollen since delivery. More swollen today. Felt SOB at home and had some chest pain and seeing spots. Pt reports had elevated b/p with this pregnancy. Seen at [facility] for prenatal care.      OB History   Grav Para Term Preterm Abortions TAB SAB Ect Mult Living   2 2 2  0 0 0 0 0 0 2      Past Medical History  Diagnosis Date  . Hypertension   . Bipolar 1 disorder   . Headache     Past Surgical History  Procedure Laterality Date  . Cesarean section    . Cesarean section N/A 07/16/2012    Procedure: Repeat cesarean section with delivery of baby  boy at 1535. Apgars 7/9.;  Surgeon: Willodean Rosenthal, MD;  Location: [facility];  Service: Obstetrics;  Laterality: N/A;    Family History  Problem Relation Age of Onset  . Other Neg Hx   . Diabetes Mother   . Hypertension Mother   . Diabetes Maternal Grandmother   . Hypertension Maternal Grandmother   . Diabetes Maternal Grandfather   . Hypertension Maternal Grandfather     History  Substance Use Topics  . Smoking status: Former Smoker -- 0.30 packs/day    Types: Cigarettes  . Smokeless tobacco: Never Used  . Alcohol Use: No    Allergies: No Known Allergies  Prescriptions prior to admission  Medication Sig Dispense Refill  . ibuprofen (ADVIL,MOTRIN) 600 MG tablet Take 1 tablet (600 mg total) by mouth every 6 (six) hours.  60 tablet  0  . oxyCODONE-acetaminophen (PERCOCET/ROXICET) 5-325 MG per tablet Take 1-2 tablets by mouth every  4 (four) hours as needed.  45 tablet  0  . Prenatal Vit-Fe Fumarate-FA (PRENATAL MULTIVITAMIN) TABS Take 1 tablet by mouth daily.        Review of Systems  Constitutional: Negative for fever, chills and malaise/fatigue.  Eyes: Positive for blurred vision.  Respiratory: Positive for cough.        Feels like she cannot take a deep breath  Cardiovascular: Positive for leg swelling. Negative for chest pain, palpitations and orthopnea.  Gastrointestinal: Negative for nausea and vomiting.  Genitourinary: Negative for dysuria.  Neurological: Positive for headaches. Negative for dizziness, sensory change, speech change and weakness.   Physical Exam   Blood pressure 140/77, pulse 100, temperature 97.8 F (36.6 C), temperature source Oral, resp. rate 20, height 5\' 2"  (1.575 m), weight 329 lb 6.4 oz (149.415 kg), last menstrual period 10/22/2011, SpO2 99.00%, currently breastfeeding.  Filed Vitals:   07/22/12 1805 07/22/12 1826 07/22/12 1831  BP: 130/75 142/62 140/77  Pulse: 91 91 100  Temp: 97.8 F (36.6 C)    TempSrc: Oral    Resp: 20    Height: 5\' 2"  (1.575 m)    Weight: 329 lb 6.4 oz (149.415 kg)    SpO2: 99% 99%     Physical Exam  Constitutional: She is oriented to person, place, and time. She appears well-developed and well-nourished. No distress (Does not appear dyspneic).  HENT:  Head: Normocephalic.  Cardiovascular: Normal rate, regular rhythm and normal heart sounds.   Respiratory: Effort normal and breath sounds normal. No respiratory distress. She has no wheezes. She has no rales. She exhibits no tenderness.  GI: Soft.  Musculoskeletal: Normal range of motion. She exhibits edema (1+ feet).  Neurological: She is alert and oriented to person, place, and time. She has normal reflexes. She displays normal reflexes. She exhibits normal muscle tone.  Skin: Skin is warm and dry.  Psychiatric: She has a normal mood and affect.  O2 Sat = 99%  MAU Course  Procedures  MDM Labs  drawn  Assessment and Plan  Will check PIH labs, more BPs and Pr/Cr Ratio Report to Pincus Badder  [facility] 07/22/2012, 6:49 PM

## 2012-07-22 NOTE — Progress Notes (Signed)
Discharged patient after reviewing PIH labs which were normal: Pr/Cr:0.13, AST/ALT: 26/28, platelet:293. Rx for HCTZ sent for BP. Patient reports she had been on HCTZ in between pregnancies for elevated BP in the past.  Patient to follow up in 7-10 days for BP check and follow up labs after being on HCTZ.   Pt seen and examined initially by Wynelle Bourgeois CNM (see note). I agree with the above plan of care for her discharge. Cam Hai 8:38 AM 07/23/2012

## 2012-07-25 ENCOUNTER — Ambulatory Visit (HOSPITAL_COMMUNITY): Admit: 2012-07-25 | Payer: 59

## 2012-08-11 ENCOUNTER — Ambulatory Visit: Payer: 59 | Admitting: Family Medicine

## 2012-08-19 ENCOUNTER — Ambulatory Visit: Payer: 59 | Admitting: Medical

## 2012-08-24 ENCOUNTER — Ambulatory Visit: Payer: 59 | Admitting: Obstetrics & Gynecology

## 2012-09-02 ENCOUNTER — Ambulatory Visit: Payer: 59 | Admitting: Obstetrics & Gynecology

## 2012-09-02 ENCOUNTER — Encounter: Payer: Self-pay | Admitting: Obstetrics & Gynecology

## 2012-09-02 DIAGNOSIS — I10 Essential (primary) hypertension: Secondary | ICD-10-CM

## 2012-09-02 DIAGNOSIS — F319 Bipolar disorder, unspecified: Secondary | ICD-10-CM

## 2012-09-02 MED ORDER — HYDROCHLOROTHIAZIDE 12.5 MG PO CAPS: 25.0000 mg | ORAL_CAPSULE | Freq: Every day | ORAL | Status: DC

## 2012-09-02 NOTE — Progress Notes (Unsigned)
Here for postpartum check. Reports pain at incision and leaking brownish discharge.

## 2012-09-02 NOTE — Progress Notes (Unsigned)
Patient ID: Joan Moore, female   DOB: June 16, 1990, 21 y.o.   MRN: [id number] Subjective:     Joan Moore is a 22 y.o. female who presents for a postpartum visit. She is 8 weeks postpartum following a low cervical transverse Cesarean section. I have fully reviewed the prenatal and intrapartum course. The delivery was at 38.5 gestational weeks. Outcome: repeat cesarean section, low transverse incision. Anesthesia: epidural. Postpartum course has been normal except depression untreeated. Baby's course has been good. Baby is feeding by bottle -  . Bleeding staining only. Bowel function is normal. Bladder function is normal. Patient is not sexually active. Contraception method is Mirena planned. Postpartum depression screening: positive.  The following portions of the patient's history were reviewed and updated as appropriate: allergies, current medications, past family history, past medical history, past social history, past surgical history and problem list.  Review of Systems Pertinent items are noted in HPI.   Objective:    BP 143/95  Pulse 88  Temp(Src) 98.2 F (36.8 C)  Ht 5\' 2"  (1.575 m)  Wt 293 lb 12.8 oz (133.267 kg)  BMI 53.72 kg/m2  LMP 09/01/2012  Breastfeeding? No  General:  alert, cooperative and no distress   Breasts:     Lungs:    Heart:     Abdomen: soft, non-tender; bowel sounds normal; no masses,  no organomegaly and incision normal healing   Vulva:  not evaluated  Vagina: not evaluated  Cervix:     Corpus: not examined  Adnexa:  not evaluated  Rectal Exam: Not performed.        Assessment:     8 week postpartum exam. Pap smear not done at today's visit.  H/O bipolar, positive depression screen, needs psychiatric f/u Hypertension  Plan:    1. Contraception: IUD 2. SW to see 3. Follow up in: 2 week or as needed. Mirena insertion. Increase HCTZ to 25 mg F/U at [facility]  ARNOLD,JAMES 09/02/2012

## 2012-09-02 NOTE — Progress Notes (Unsigned)
09/02/12-CSW contacted by clinic staff for patient's score of 14 on PPD screen.  CSW attempted to meet with patient, whom CSW met with while inpatient, but provider was just about to see her.  CSW requested to be called when patient was ready for CSW, but then CSW was unable to get back to clinic to meet with patient before she left.  CSW attempted to call patient, but phone gave a message that it was not receiving calls and did not allow CSW to leave a message.  CSW will attempt again at a later time. 09/12/12-CSW has attempted numerous times to reach patient, but phone is not receiving calls.  Please re-consult CSW on future visits as needed.

## 2012-09-06 ENCOUNTER — Ambulatory Visit: Payer: 59 | Admitting: Family Medicine

## 2012-10-03 ENCOUNTER — Encounter: Payer: Self-pay | Admitting: Family Medicine

## 2012-10-03 ENCOUNTER — Ambulatory Visit (INDEPENDENT_AMBULATORY_CARE_PROVIDER_SITE_OTHER): Payer: 59 | Admitting: Family Medicine

## 2012-10-03 VITALS — BP 155/89 | Temp 97.9°F | Ht 62.0 in | Wt 299.0 lb

## 2012-10-03 DIAGNOSIS — R42 Dizziness and giddiness: Secondary | ICD-10-CM

## 2012-10-03 NOTE — Patient Instructions (Addendum)
Thank you for coming in today, it was good to see you Your blood pressure is elevated, restart your blood pressure medication Try to get more sleep, this can help your blood pressure and overall health Follow up with me in 2 weeks.

## 2012-10-03 NOTE — Progress Notes (Signed)
  Subjective:    Patient ID: Joan Moore, female    DOB: 25-Aug-1990, 22 y.o.   MRN: [id number]  HPI  1.  Dizziness:  C/o feeling dizzy intermittently since delivering child in February.  Has history of chronic HTN and has not been taking her HCTZ.  Describes feeling as "being high".  Dizzy feeling is not worse with standing and does not seem to be positional. Does not feel like she is going to pass out.  She does endorse occasional headaches and has a history of migraines.  She reports decreased sleep due to having to care for her infant but father and grandmother are available to help if needed.  Denies weakness, slurred speech, sensation changes, nausea or vomiting.   Review of Systems Per HPI    Objective:   Physical Exam  Constitutional: She is oriented to person, place, and time.  Obese female, nad   HENT:  Head: Normocephalic and atraumatic.  TM normal B/l   Eyes: Conjunctivae and EOM are normal. Pupils are equal, round, and reactive to light.  Neck: Neck supple.  Cardiovascular: Normal rate, regular rhythm and normal heart sounds.   Pulmonary/Chest: Effort normal and breath sounds normal.  Musculoskeletal: She exhibits no edema.  Neurological: She is alert and oriented to person, place, and time. Coordination normal.          Assessment & Plan:

## 2012-10-03 NOTE — Assessment & Plan Note (Signed)
Possibly related to elevated blood pressure along with lack of sleep.  Does not sound pre-syncopal.  Instructed to restart her HCTZ and try to get more rest.  Follow up in two weeks to see if this has improved.

## 2012-10-17 ENCOUNTER — Ambulatory Visit (INDEPENDENT_AMBULATORY_CARE_PROVIDER_SITE_OTHER): Payer: 59 | Admitting: Family Medicine

## 2012-10-17 ENCOUNTER — Encounter: Payer: Self-pay | Admitting: Family Medicine

## 2012-10-17 VITALS — BP 115/77 | HR 99 | Temp 98.1°F | Ht 62.0 in | Wt 299.0 lb

## 2012-10-17 DIAGNOSIS — I10 Essential (primary) hypertension: Secondary | ICD-10-CM

## 2012-10-17 DIAGNOSIS — R42 Dizziness and giddiness: Secondary | ICD-10-CM

## 2012-10-17 LAB — BASIC METABOLIC PANEL
Calcium: 9.4 mg/dL (ref 8.4–10.5)
Creat: 0.63 mg/dL (ref 0.50–1.10)

## 2012-10-17 NOTE — Patient Instructions (Signed)
Thank you for coming in today, it was good to see you Your blood pressure looks much better today Follow up as needed or in 6 months.

## 2012-10-17 NOTE — Assessment & Plan Note (Signed)
Improved with better control of her HTN.  Instructed to return for worsening symptoms.

## 2012-10-17 NOTE — Assessment & Plan Note (Signed)
BP much improved today.  Check bmet to be sure renal function and electrolytes look ok.  Continue HCTZ, f/u 6 months.

## 2012-10-17 NOTE — Progress Notes (Signed)
  Subjective:    Patient ID: Joan Moore, female    DOB: 1991-04-08, 22 y.o.   MRN: [id number]  HPI  1. HTN: CHRONIC HYPERTENSION  Disease Monitoring  Blood pressure range: No self monitoring at home  Chest pain: no   Dyspnea: no   Claudication: no   Medication compliance: yes  Medication Side Effects  Lightheadedness: no, dizziness that she had previously has improved since restarting HCTZ   Urinary frequency: no   Edema: no   Preventitive Healthcare:  Exercise: yes, but minimal walking   Diet Pattern: Fairly unhealthy, no restrictions.  Would like to visit with nutritionist  Salt Restriction: No    Review of Systems Per HPI    Objective:   Physical Exam  Constitutional: No distress.  Obese   Eyes: EOM are normal. Pupils are equal, round, and reactive to light.  Cardiovascular: Normal rate and regular rhythm.   Pulmonary/Chest: Effort normal and breath sounds normal.  Musculoskeletal: She exhibits no edema.  Neurological: She is alert. Coordination and gait normal.          Assessment & Plan:

## 2012-11-03 ENCOUNTER — Ambulatory Visit: Payer: 59 | Admitting: Family Medicine

## 2013-01-27 ENCOUNTER — Ambulatory Visit (INDEPENDENT_AMBULATORY_CARE_PROVIDER_SITE_OTHER): Payer: 59 | Admitting: Emergency Medicine

## 2013-01-27 ENCOUNTER — Encounter: Payer: Self-pay | Admitting: Emergency Medicine

## 2013-01-27 VITALS — BP 130/80 | HR 76 | Ht 62.0 in | Wt 318.8 lb

## 2013-01-27 DIAGNOSIS — R52 Pain, unspecified: Secondary | ICD-10-CM

## 2013-01-27 DIAGNOSIS — Z720 Tobacco use: Secondary | ICD-10-CM

## 2013-01-27 DIAGNOSIS — I10 Essential (primary) hypertension: Secondary | ICD-10-CM

## 2013-01-27 DIAGNOSIS — M62830 Muscle spasm of back: Secondary | ICD-10-CM

## 2013-01-27 DIAGNOSIS — M5380 Other specified dorsopathies, site unspecified: Secondary | ICD-10-CM

## 2013-01-27 DIAGNOSIS — F17200 Nicotine dependence, unspecified, uncomplicated: Secondary | ICD-10-CM

## 2013-01-27 MED ORDER — CYCLOBENZAPRINE HCL 5 MG PO TABS: 5.0000 mg | ORAL_TABLET | Freq: Three times a day (TID) | ORAL | Status: DC | PRN

## 2013-01-27 MED ORDER — HYDROCHLOROTHIAZIDE 25 MG PO TABS: 25.0000 mg | ORAL_TABLET | Freq: Every day | ORAL | Status: DC

## 2013-01-27 NOTE — Assessment & Plan Note (Addendum)
I suspect OSA leading to her multiple concerns - including muscle aches, headaches, and nausea. Will order a sleep study. Would like to get A1c, TSH, CMP, and lipids as well, but patient left before I could order these tests.  Will get them at follow up. Follow up after the sleep study.

## 2013-01-27 NOTE — Patient Instructions (Addendum)
It was nice to meet you!  We are checking some blood work today.  I will call you if anything is wrong, otherwise you will get a letter with the results in the mail in 1-2 weeks.  I have also ordered a sleep study to look for sleep apnea.  They should call you to schedule this.  Take Flexeril 3 times a day as needed for muscle spasms.  It might make you tired or loopy.  Please stop smoking!!!  Follow up with me 1-2 weeks after the sleep study.

## 2013-01-27 NOTE — Assessment & Plan Note (Signed)
Likely secondary to obesity and lumbar lordosis. Will give flexeril 5mg  PO TID prn.  Discussed side effects.

## 2013-01-27 NOTE — Assessment & Plan Note (Signed)
Counseled on quitting °

## 2013-01-27 NOTE — Progress Notes (Signed)
  Subjective:    Patient ID: Joan Moore, female    DOB: [birth year], 22 y.o.   MRN: [id number]  HPI Joan Moore is here for body aches and pains.  She reports a long history (about 3 years) of diffuse body aches and pains.  She states that these are mostly in her muscle and it comes and goes.  Can be in her upper arms, chest and legs.  Denies any pain today.  Also reports muscle spasms in her back.  Feels like when her muscles hurt, they are weak and feel like they are falling asleep.  Also gets frequent headaches (respond to tylenol/motrin) and intermittent nausea.  She states that she does not sleep well, does not feel rested on waking.  Her husband reports that she snores.  I have reviewed and updated the following as appropriate: allergies, current medications, past family history, past medical history, past social history, past surgical history and problem list PMHx: HTN  FHx: diabetes, htn SHx: current smoker  Review of Systems See HPI    Objective:   Physical Exam BP 130/80  Pulse 76  Ht 5\' 2"  (1.575 m)  Wt 318 lb 12.8 oz (144.607 kg)  BMI 58.29 kg/m2  LMP 01/13/2013  Breastfeeding? No Gen: alert, cooperative, NAD, morbidly obese HEENT: AT/, sclera white, MMM Neck: supple CV: RRR, no murmurs Pulm: CTAB, no wheezes or rales Ext: no edema MSK: no point tenderness over fibromyalgia spots Neuro: normal gait Back: excessive lordosis of lumbar spine; no paraspinous spasm appreciated     Assessment & Plan:

## 2013-03-12 ENCOUNTER — Ambulatory Visit (HOSPITAL_BASED_OUTPATIENT_CLINIC_OR_DEPARTMENT_OTHER): Payer: 59

## 2013-03-22 ENCOUNTER — Encounter: Payer: Self-pay | Admitting: Emergency Medicine

## 2013-03-22 ENCOUNTER — Ambulatory Visit (INDEPENDENT_AMBULATORY_CARE_PROVIDER_SITE_OTHER): Payer: 59 | Admitting: Emergency Medicine

## 2013-03-22 VITALS — BP 143/86 | HR 80 | Temp 98.9°F | Ht 62.0 in | Wt 320.0 lb

## 2013-03-22 DIAGNOSIS — N912 Amenorrhea, unspecified: Secondary | ICD-10-CM

## 2013-03-22 DIAGNOSIS — R1031 Right lower quadrant pain: Secondary | ICD-10-CM | POA: Insufficient documentation

## 2013-03-22 DIAGNOSIS — R109 Unspecified abdominal pain: Secondary | ICD-10-CM

## 2013-03-22 LAB — CBC
MCH: 27.4 pg (ref 26.0–34.0)
Platelets: 359 10*3/uL (ref 150–400)
RBC: 4.57 MIL/uL (ref 3.87–5.11)
RDW: 15.5 % (ref 11.5–15.5)
WBC: 8.1 10*3/uL (ref 4.0–10.5)

## 2013-03-22 LAB — POCT GLYCOSYLATED HEMOGLOBIN (HGB A1C): Hemoglobin A1C: 5.4

## 2013-03-22 MED ORDER — POLYETHYLENE GLYCOL 3350 17 GM/SCOOP PO POWD: 17.0000 g | Freq: Every day | ORAL | Status: DC | PRN

## 2013-03-22 NOTE — Patient Instructions (Addendum)
It was nice to see you!  Your pregnancy test was negative.  We are check an ultrasound and some blood work today.  Follow up with me in 2 weeks to go over your results and do a pelvic exam.

## 2013-03-22 NOTE — Assessment & Plan Note (Addendum)
Miscarriage vs constipation vs ovarian cysts. Urine preg negative. Will check pelvis ultrasound. Miralax daily prn for constipation. Checking blood work as well. Follow up in 2 weeks.

## 2013-03-22 NOTE — Progress Notes (Signed)
  Subjective:    Patient ID: Joan Moore, female    DOB: 12-Apr-1991, 22 y.o.   MRN: [id number]  HPI Joan Moore is here for a SDA for abdominal pain.  She reports that over the last 6 weeks she has had intermittent sharp abdominal pains.  These occur every few days and last for a few seconds.  They are located mostly in the lower pelvic region, but can occur in the stomach and right side as well.  She describes constipation with small amount of blood with BMs.  No dysuria, but does have some frequency.  Also reports irregular periods with LMP in 01/2013.  She did have a positive pregnancy test at home on Sunday but then had a negative one afterwards.  No vaginal discharge or bleeding.  Has had some intermittent nausea and vomiting the last 2 weeks.  Tolerating fluids well.  I have reviewed and updated the following as appropriate: allergies and current medications SHx: current smoker   Review of Systems See HPI    Objective:   Physical Exam BP 143/86  Pulse 80  Temp(Src) 98.9 F (37.2 C) (Oral)  Ht 5\' 2"  (1.575 m)  Wt 320 lb (145.151 kg)  BMI 58.51 kg/m2  LMP 01/06/2013  Breastfeeding? No Gen: alert, cooperative, obese, NAD Abd: +BS, soft, NTND     Assessment & Plan:

## 2013-03-23 ENCOUNTER — Telehealth: Payer: Self-pay | Admitting: *Deleted

## 2013-03-23 LAB — COMPREHENSIVE METABOLIC PANEL
ALT: 11 U/L (ref 0–35)
AST: 11 U/L (ref 0–37)
BUN: 11 mg/dL (ref 6–23)
Creat: 0.63 mg/dL (ref 0.50–1.10)
Total Bilirubin: 0.3 mg/dL (ref 0.3–1.2)

## 2013-03-23 LAB — LIPID PANEL
Cholesterol: 97 mg/dL (ref 0–200)
HDL: 41 mg/dL (ref >39)
Total CHOL/HDL Ratio: 2.4 Ratio
VLDL: 15 mg/dL (ref 0–40)

## 2013-03-23 LAB — TSH: TSH: 0.872 u[IU]/mL (ref 0.350–4.500)

## 2013-03-23 NOTE — Telephone Encounter (Signed)
Pelvic and transvaginal ultrasound scheduled at [address] For Monday March 27, 2013 at 10:45am.  Pt is to drink 32 oz of water one hour prior to exam and to hold bladder.  Attempted to reach pt to notify, but left message for patient to call back. Genie Mirabal, Darlyne Russian, CMA

## 2013-03-23 NOTE — Addendum Note (Signed)
Addended by: Farrell Ours on: 03/23/2013 08:49 AM   Modules accepted: Orders

## 2013-03-27 ENCOUNTER — Other Ambulatory Visit: Payer: 59

## 2013-03-28 ENCOUNTER — Other Ambulatory Visit: Payer: Self-pay | Admitting: Family Medicine

## 2013-03-28 ENCOUNTER — Telehealth: Payer: Self-pay | Admitting: Emergency Medicine

## 2013-03-28 DIAGNOSIS — R102 Pelvic and perineal pain: Secondary | ICD-10-CM

## 2013-03-28 NOTE — Telephone Encounter (Signed)
Pt called to let us know that she needs the referral sent in for her ultrasound. She has had to cancel because they say there are no orders. JW

## 2013-03-29 NOTE — Telephone Encounter (Signed)
I have re-ordered the pelvic ultrasound.

## 2013-03-29 NOTE — Telephone Encounter (Signed)
I called and spoke with [facility] because message from patient didn't make sense.  Patient did NOT show for her appt, therefore the order needs to be re entered into [facility] please.  Then patient can call on her own this time to schedule appt.  Attempted to reach patient and her number is a fax number.  Will ask MD to please re enter orders and await pt's call back.  Dove Gresham, Darlyne Russian, CMA

## 2013-04-03 ENCOUNTER — Ambulatory Visit
Admission: RE | Admit: 2013-04-03 | Discharge: 2013-04-03 | Disposition: A | Discharge status: Home / Self Care | Payer: 59 | Source: Ambulatory Visit | Attending: Family Medicine | Admitting: Family Medicine

## 2013-04-03 DIAGNOSIS — R102 Pelvic and perineal pain: Secondary | ICD-10-CM

## 2013-04-09 ENCOUNTER — Ambulatory Visit (HOSPITAL_BASED_OUTPATIENT_CLINIC_OR_DEPARTMENT_OTHER): Payer: 59 | Attending: Family Medicine

## 2013-04-10 ENCOUNTER — Encounter: Payer: Self-pay | Admitting: Emergency Medicine

## 2013-04-10 ENCOUNTER — Ambulatory Visit (INDEPENDENT_AMBULATORY_CARE_PROVIDER_SITE_OTHER): Payer: 59 | Admitting: Emergency Medicine

## 2013-04-10 VITALS — BP 133/97 | HR 87 | Temp 99.4°F | Ht 62.0 in | Wt 312.0 lb

## 2013-04-10 DIAGNOSIS — I10 Essential (primary) hypertension: Secondary | ICD-10-CM

## 2013-04-10 DIAGNOSIS — R109 Unspecified abdominal pain: Secondary | ICD-10-CM

## 2013-04-10 NOTE — Progress Notes (Signed)
  Subjective:    Patient ID: Joan Moore, female    DOB: 1991-06-01, 22 y.o.   MRN: [id number]  HPI Joan Moore is here for results review.  Results review She was seen 2 weeks ago for pelvic pain.  An ultrasound was done.  I reviewed the results (which were normal) with the patient.  Her symptoms have resolved except for a fluttering sensation in her lower abdomen periodically.  She is having regular BMs now.  Hypertension Compliant with medication: no - has not been taking HCTZ Side effects from medication: no Check BP at home: no  Chest pain: no Palpitations: no Vision changes: no Leg edema: no Dizziness: no  I have reviewed and updated the following as appropriate: allergies and current medications SHx: current smoker  Review of Systems See HPI    Objective:   Physical Exam BP 133/97  Pulse 87  Temp(Src) 99.4 F (37.4 C) (Oral)  Ht 5\' 2"  (1.575 m)  Wt 312 lb (141.522 kg)  BMI 57.05 kg/m2  LMP 01/06/2013 Gen: alert, cooperative, NAD, morbidly obese     Assessment & Plan:

## 2013-04-10 NOTE — Patient Instructions (Signed)
It was nice to see you!  Your ultrasound was normal.  The fluttering feeling is likely gas or muscle twitches.  Follow up in 3 months for blood pressure check or sooner as needed.

## 2013-04-10 NOTE — Assessment & Plan Note (Signed)
Elevated today. Has not been taking her HCTZ. Restart HCTZ and f/u in 3 months.

## 2013-04-10 NOTE — Assessment & Plan Note (Signed)
Resolved. Still with fluttering sensation periodically - likely gas vs muscle twitches at this point.

## 2013-10-11 ENCOUNTER — Other Ambulatory Visit (INDEPENDENT_AMBULATORY_CARE_PROVIDER_SITE_OTHER): Payer: 59

## 2013-10-11 ENCOUNTER — Ambulatory Visit: Payer: 59 | Admitting: Emergency Medicine

## 2013-10-11 DIAGNOSIS — N912 Amenorrhea, unspecified: Secondary | ICD-10-CM

## 2013-10-11 LAB — POCT URINE PREGNANCY: Preg Test, Ur: NEGATIVE

## 2013-10-11 NOTE — Progress Notes (Signed)
    Pt was scheduled for a 2:30 pm ov with Dr. Piedad ClimesHonig.  Per Arlyss Represshekla Slade, CMA, appt was changed to lab only and urine pregnancy test was run.  Thekla told patient the result.   Dewitt HoesBAJORDAN, MLS

## 2013-10-17 ENCOUNTER — Other Ambulatory Visit: Payer: 59

## 2013-10-17 ENCOUNTER — Ambulatory Visit: Payer: 59 | Admitting: Emergency Medicine

## 2013-11-08 ENCOUNTER — Ambulatory Visit: Payer: 59

## 2013-11-15 ENCOUNTER — Ambulatory Visit: Payer: 59 | Admitting: Family Medicine

## 2013-11-20 ENCOUNTER — Ambulatory Visit: Payer: 59 | Admitting: Family Medicine

## 2013-11-21 ENCOUNTER — Ambulatory Visit: Payer: 59 | Admitting: Family Medicine

## 2014-01-04 ENCOUNTER — Ambulatory Visit: Payer: 59 | Admitting: Family Medicine

## 2014-01-08 ENCOUNTER — Ambulatory Visit (INDEPENDENT_AMBULATORY_CARE_PROVIDER_SITE_OTHER): Payer: 59 | Admitting: Family Medicine

## 2014-01-08 ENCOUNTER — Encounter: Payer: Self-pay | Admitting: Family Medicine

## 2014-01-08 VITALS — BP 141/82 | HR 85 | Temp 98.2°F | Ht 62.0 in | Wt 311.0 lb

## 2014-01-08 DIAGNOSIS — G43909 Migraine, unspecified, not intractable, without status migrainosus: Secondary | ICD-10-CM

## 2014-01-08 MED ORDER — SUMATRIPTAN SUCCINATE 50 MG PO TABS: ORAL_TABLET | ORAL | Status: DC

## 2014-01-08 NOTE — Assessment & Plan Note (Addendum)
Consistent with recurrent migraine headache, unclear trigger - Hx of prior similar migraines, now with worsening - Also suspect multifactorial component with rebound headache in setting of regular Excedrin and NSAID use  Plan: 1. Start Imitrex 50mg  tabs - take 1 for severe headache, repeat dose in 2 hours if not resolved. If needed inc dose to 100mg  per dose (max 200mg  daily) 2. Discontinue Excedrin, avoid caffeine, reduce NSAIDs 3. Continue Tylenol PRN 4. Increase hydration, encouraged regular exercise / improved diet 5. Headache Journal to next OV 6. RTC for further evaluation in 2-4 weeks. If improved on Imitrex, anticipate need for migraine prophylaxis, if not improved with Imitrex, consider headache alternatives with rebound HA / tension HA / status migrainosus. Unlikely but should consider pseudotumor cerebri if persistently non-responsive to therapy

## 2014-01-08 NOTE — Progress Notes (Signed)
Subjective:     Patient ID: Joan Moore, female   DOB: Feb 02, 1991, 23 y.o.   MRN: [id number]  Patient presents for same day appointment.  HPI  Headaches: - Symptoms started yesterday morning after waking up with Right sided headache, location of pain starts in center of face / behind eyes, and radiates to back of head - Associated symptoms with radiation of pain to Right ear and jaw - History of migraines before, last severe migraine was several years ago - Currently over past 1 year, headaches every other week lasting few hours, now with worsening headaches / inc frequency to daily for past 1 month - Improved after rest and with standing up - Worse with lights, sounds, smells - Denies any changes in vision, fevers/chills, abd pain, n/v. Not sick recently - Tried Excedrin, Aleve without improvement. Tried Hydrocodone 5/325 (from Mother) no relief - Ibuprofen (400mg  2-3x daily usually helps), took Ibuprofen 800mg  x 1 dose without relief - Also takes Flexeril for back spasms, without relief of headache - Denies any caffeine intake (no coffee, tea, or soda), only drinks water, juice, milk  I have reviewed and updated the following as appropriate: allergies and current medications  Social Hx: - Former smoker (06/2013) - No alcohol or drugs  Review of Systems  See above HPI    Objective:   Physical Exam  BP 141/82  Pulse 85  Temp(Src) 98.2 F (36.8 C) (Oral)  Ht 5\' 2"  (1.575 m)  Wt 311 lb (141.069 kg)  BMI 56.87 kg/m2  LMP 12/18/2013  Gen - obese, well-appearing, mild discomfort due to pain, NAD HEENT - NCAT, PERRL, EOMI, patent nares w/o congestion, b/l TM's normal, oropharynx clear, MMM Neck - supple, non-tender, FROM Heart - RRR, no murmurs heard Lungs - CTAB Skin - warm, dry Neuro - awake, alert, oriented, grossly non-focal, intact muscle strength 5/5 b/l, intact distal sensation to light touch, gait normal     Assessment:     See specific A&P problem list for  details.      Plan:     See specific A&P problem list for details.

## 2014-01-08 NOTE — Patient Instructions (Signed)
Dear Joan Moore, Thank you for coming in to clinic today.  Today we discussed your Headaches. 1. It sounds like you are having headaches that may be caused by multiple factors. 2. Likely there is a migraine component, and I have prescribed Imitrex 50mg  tablets - follow instructions on label, take 1 tab with headache, may repeat dose in 2 hours if not resolved. If after Day 1 your headache persists, try taking 2 tablets (total 100mg  per dose) with headache and then repeat dose 100mg  in 2 hours. DO NOT TAKE MORE THAN 200mg  daily (or 4 tablets). 3. Also, encourage you to take Tylenol and avoid Excedrin - this can cause rebound headaches. 4. Increase hydration with water, avoid triggers 5. Make sure that you keep a Headache Journal - to determine triggers, bring this to your next visit.  Please schedule a follow-up appointment with Dr. Beryle Flock (PCP) in 2-4 weeks for headache follow-up.  If you have any other questions or concerns, please feel free to call the clinic to contact me. You may also schedule an earlier appointment if necessary.  However, if your symptoms get significantly worse, please go to the Emergency Department to seek immediate medical attention.  Saralyn Pilar, DO [facility]   Migraine Headache A migraine headache is an intense, throbbing pain on one or both sides of your head. A migraine can last for 30 minutes to several hours. CAUSES  The exact cause of a migraine headache is not always known. However, a migraine may be caused when nerves in the brain become irritated and release chemicals that cause inflammation. This causes pain. Certain things may also trigger migraines, such as:  Alcohol.  Smoking.  Stress.  Menstruation.  Aged cheeses.  Foods or drinks that contain nitrates, glutamate, aspartame, or tyramine.  Lack of sleep.  Chocolate.  Caffeine.  Hunger.  Physical exertion.  Fatigue.  Medicines used to treat chest  pain (nitroglycerine), birth control pills, estrogen, and some blood pressure medicines. SIGNS AND SYMPTOMS  Pain on one or both sides of your head.  Pulsating or throbbing pain.  Severe pain that prevents daily activities.  Pain that is aggravated by any physical activity.  Nausea, vomiting, or both.  Dizziness.  Pain with exposure to bright lights, loud noises, or activity.  General sensitivity to bright lights, loud noises, or smells. Before you get a migraine, you may get warning signs that a migraine is coming (aura). An aura may include:  Seeing flashing lights.  Seeing bright spots, halos, or zigzag lines.  Having tunnel vision or blurred vision.  Having feelings of numbness or tingling.  Having trouble talking.  Having muscle weakness. DIAGNOSIS  A migraine headache is often diagnosed based on:  Symptoms.  Physical exam.  A CT scan or MRI of your head. These imaging tests cannot diagnose migraines, but they can help rule out other causes of headaches. TREATMENT Medicines may be given for pain and nausea. Medicines can also be given to help prevent recurrent migraines.  HOME CARE INSTRUCTIONS  Only take over-the-counter or prescription medicines for pain or discomfort as directed by your health care provider. The use of long-term narcotics is not recommended.  Lie down in a dark, quiet room when you have a migraine.  Keep a journal to find out what may trigger your migraine headaches. For example, write down:  What you eat and drink.  How much sleep you get.  Any change to your diet or medicines.  Limit alcohol consumption.  Quit smoking if you smoke.  Get 7-9 hours of sleep, or as recommended by your health care provider.  Limit stress.  Keep lights dim if bright lights bother you and make your migraines worse. SEEK IMMEDIATE MEDICAL CARE IF:   Your migraine becomes severe.  You have a fever.  You have a stiff neck.  You have vision  loss.  You have muscular weakness or loss of muscle control.  You start losing your balance or have trouble walking.  You feel faint or pass out.  You have severe symptoms that are different from your first symptoms. MAKE SURE YOU:   Understand these instructions.  Will watch your condition.  Will get help right away if you are not doing well or get worse. Document Released: 05/25/2005 Document Revised: 10/09/2013 Document Reviewed: 01/30/2013 [facility] Patient Information 2015 [place], [place]. This information is not intended to replace advice given to you by your health care provider. Make sure you discuss any questions you have with your health care provider.

## 2014-01-09 ENCOUNTER — Encounter: Payer: Self-pay | Admitting: Family Medicine

## 2014-01-09 ENCOUNTER — Ambulatory Visit (INDEPENDENT_AMBULATORY_CARE_PROVIDER_SITE_OTHER): Payer: 59 | Admitting: Family Medicine

## 2014-01-09 VITALS — BP 132/80 | HR 88 | Temp 98.7°F | Ht 62.0 in | Wt 310.0 lb

## 2014-01-09 DIAGNOSIS — R109 Unspecified abdominal pain: Secondary | ICD-10-CM

## 2014-01-09 DIAGNOSIS — G43909 Migraine, unspecified, not intractable, without status migrainosus: Secondary | ICD-10-CM

## 2014-01-09 LAB — POCT URINE PREGNANCY: PREG TEST UR: NEGATIVE

## 2014-01-09 MED ORDER — METHYLPREDNISOLONE ACETATE 40 MG/ML IJ SUSP
40.0000 mg | Freq: Once | INTRAMUSCULAR | Status: AC
  Administered 2014-01-09: 40 mg via INTRAMUSCULAR

## 2014-01-09 MED ORDER — KETOROLAC TROMETHAMINE 30 MG/ML IJ SOLN
30.0000 mg | Freq: Once | INTRAMUSCULAR | Status: AC
  Administered 2014-01-09: 30 mg via INTRAMUSCULAR

## 2014-01-09 MED ORDER — PROMETHAZINE HCL 25 MG/ML IJ SOLN
25.0000 mg | Freq: Once | INTRAMUSCULAR | Status: AC
  Administered 2014-01-09: 25 mg via INTRAMUSCULAR

## 2014-01-09 NOTE — Patient Instructions (Signed)
Great to meet you!  Come back in 2-3 weeks to see your PCP  Migraine Headache A migraine headache is very bad, throbbing pain on one or both sides of your head. Talk to your doctor about what things may bring on (trigger) your migraine headaches. HOME CARE  Only take medicines as told by your doctor.  Lie down in a dark, quiet room when you have a migraine.  Keep a journal to find out if certain things bring on migraine headaches. For example, write down:  What you eat and drink.  How much sleep you get.  Any change to your diet or medicines.  Lessen how much alcohol you drink.  Quit smoking if you smoke.  Get enough sleep.  Lessen any stress in your life.  Keep lights dim if bright lights bother you or make your migraines worse. GET HELP RIGHT AWAY IF:   Your migraine becomes really bad.  You have a fever.  You have a stiff neck.  You have trouble seeing.  Your muscles are weak, or you lose muscle control.  You lose your balance or have trouble walking.  You feel like you will pass out (faint), or you pass out.  You have really bad symptoms that are different than your first symptoms. MAKE SURE YOU:   Understand these instructions.  Will watch your condition.  Will get help right away if you are not doing well or get worse. Document Released: 03/03/2008 Document Revised: 08/17/2011 Document Reviewed: 01/30/2013 [facility] Patient Information 2015 [place], [place]. This information is not intended to replace advice given to you by your health care provider. Make sure you discuss any questions you have with your health care provider.

## 2014-01-09 NOTE — Assessment & Plan Note (Signed)
Agree with previous assessment, likely migraine headache No improvement with Imitrex No neurologic deficits Considering temporal arteritis, check ESR Treat with headache cocktail today: 40 mg Depo-Medrol, 25 mg Phenergan, 30 mg Toradol all IM Followup in 2-3 weeks with PCP to consider migraine prophylaxis

## 2014-01-09 NOTE — Progress Notes (Signed)
Patient ID: Joan Moore, female   DOB: 09/19/1990, 23 y.o.   MRN: [id number]  Kenn File, MD Phone: [phone]  Subjective:  Chief complaint-noted  Pt Here for followup headache  Patient was seen for the headache one day ago and started on Imitrex. She states that she took Imitrex and it did not help.  She describes that she has had a right-sided throbbing headache for the last 3 days now. She states that the headache onset was when she woke up Sunday morning and has worsened and continued She has also taking Motrin, Excedrin, Aleve, and Norco without any improvement. No GI symptoms- n/v/d Developed scotoma last night, 5-10 bright dots in R eye + R temple and face tenderness Has had a headache this severe previously.   ROS- Per HPI  Past Medical History Patient Active Problem List   Diagnosis Date Noted  . Abdominal pain, unspecified site 03/22/2013  . Back muscle spasm 01/27/2013  . Body aches 01/27/2013  . Tobacco abuse 01/27/2013  . Dizziness 10/03/2012  . Acanthosis nigricans 04/17/2011  . Hypertension 11/19/2010  . MIGRAINE HEADACHE 06/16/2010  . OBESITY 10/18/2007  . BIPOLAR DEPRESSION 10/18/2007  . ECZEMA, ATOPIC DERMATITIS 08/05/2006    Medications- reviewed and updated Current Outpatient Prescriptions  Medication Sig Dispense Refill  . cyclobenzaprine (FLEXERIL) 5 MG tablet Take 1 tablet (5 mg total) by mouth 3 (three) times daily as needed for muscle spasms.  30 tablet  1  . hydrochlorothiazide (HYDRODIURIL) 25 MG tablet Take 1 tablet (25 mg total) by mouth daily.  90 tablet  3  . polyethylene glycol powder (GLYCOLAX/MIRALAX) powder Take 17 g by mouth daily as needed (constipation).  3350 g  1  . SUMAtriptan (IMITREX) 50 MG tablet Take 1 tablet (50 mg total) by mouth for headache. May repeat dose once in 2 hours. Do not take more than 2106m in 24 hours.  14 tablet  0   No current facility-administered medications for this visit.    Objective: BP  132/80  Pulse 88  Temp(Src) 98.7 F (37.1 C) (Oral)  Ht 5' 2"  (1.575 m)  Wt 310 lb (140.615 kg)  BMI 56.69 kg/m2  LMP 12/18/2013 Gen: NAD, alert, cooperative with exam HEENT: NCAT, EOMI, no papilledema Ext: No edema, warm Neuro: Alert and oriented, strength 5/5 in bilateral upper and lower extremity, cranial nerve II through XII intact, bilateral patellar reflexes 2+   Assessment/Plan:  MIGRAINE HEADACHE Agree with previous assessment, likely migraine headache No improvement with Imitrex No neurologic deficits Considering temporal arteritis, check ESR Treat with headache cocktail today: 40 mg Depo-Medrol, 25 mg Phenergan, 30 mg Toradol all IM Followup in 2-3 weeks with PCP to consider migraine prophylaxis     Orders Placed This Encounter  Procedures  . Sedimentation Rate    Meds ordered this encounter  Medications  . methylPREDNISolone acetate (DEPO-MEDROL) injection 40 mg    Sig:   . promethazine (PHENERGAN) injection 25 mg    Sig:   . ketorolac (TORADOL) 30 MG/ML injection 30 mg    Sig:

## 2014-01-10 LAB — SEDIMENTATION RATE: SED RATE: 12 mm/h (ref 0–22)

## 2014-02-03 IMAGING — US US OB DETAIL+14 WK
1 series · 12 of 28 positions shown · non-contrast
Comparison: none

[Series 1: us ob detail +14 wk · 12 of 81 slices shown]
[im 3/81]
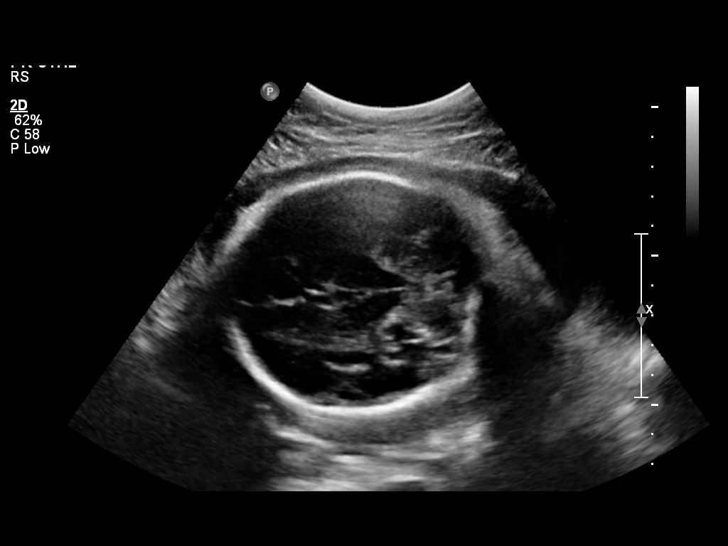
[im 9/81]
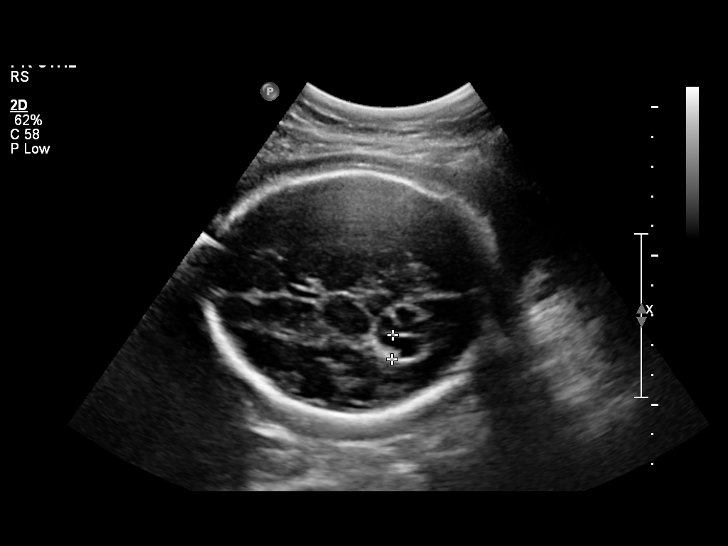
[im 15/81]
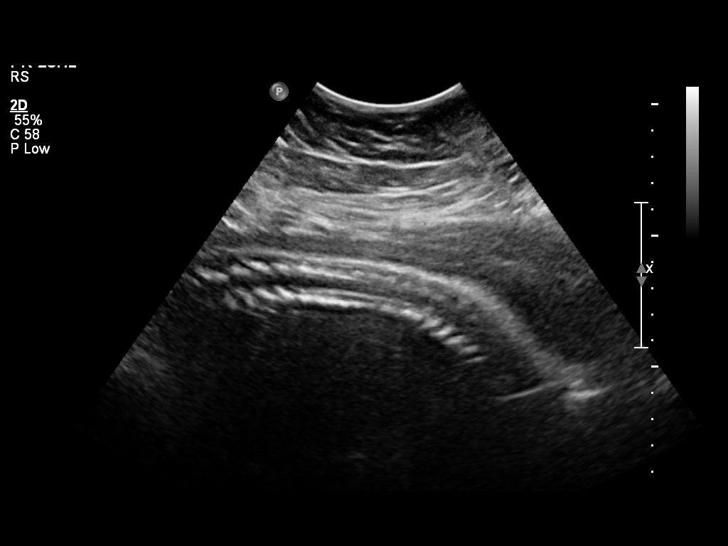
[im 24/81]
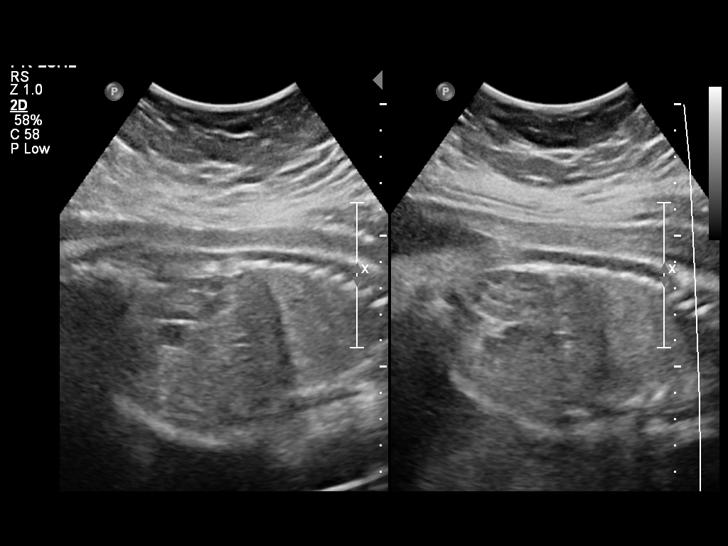
[im 30/81]
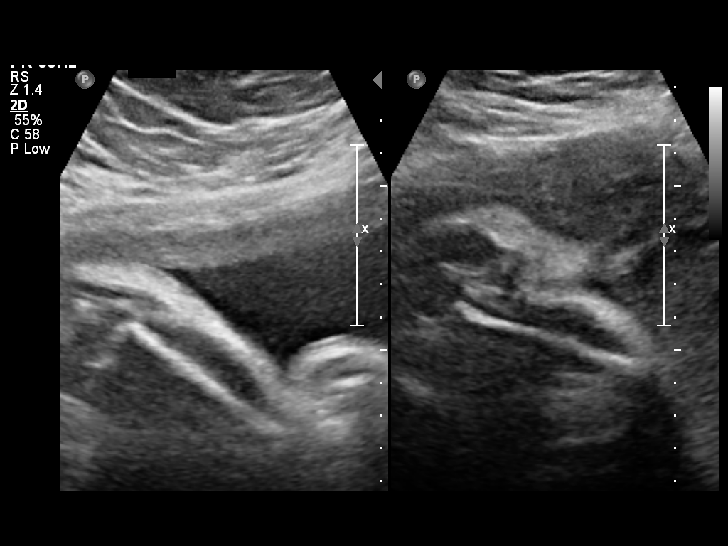
[im 36/81]
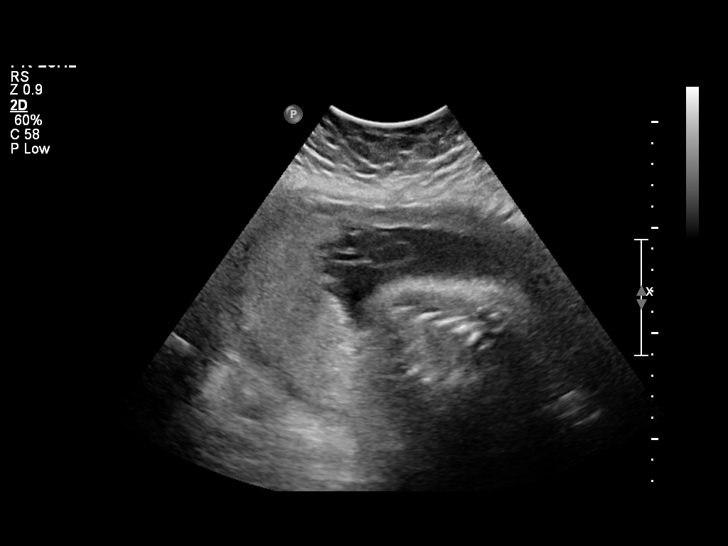
[im 45/81]
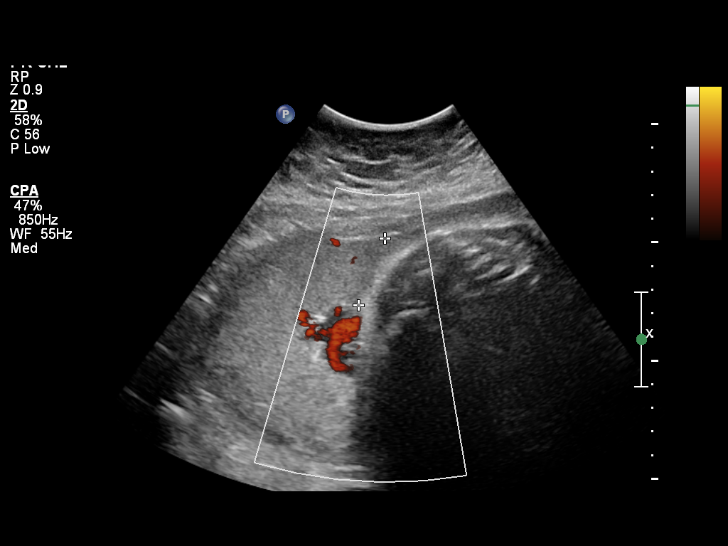
[im 51/81]
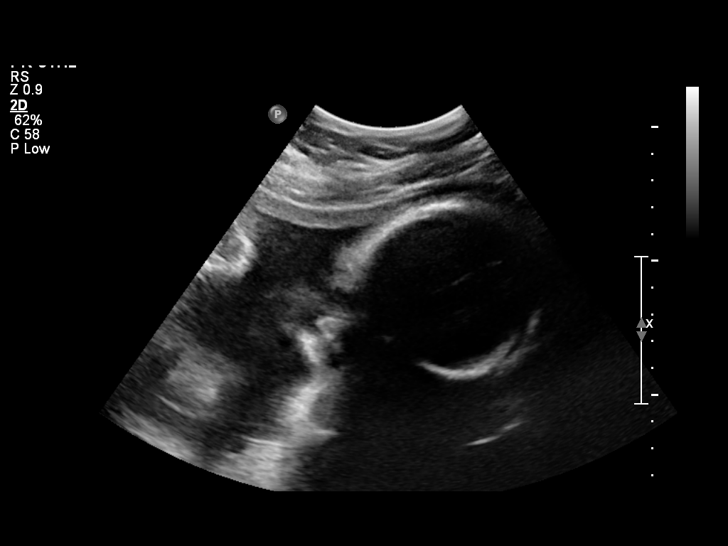
[im 57/81]
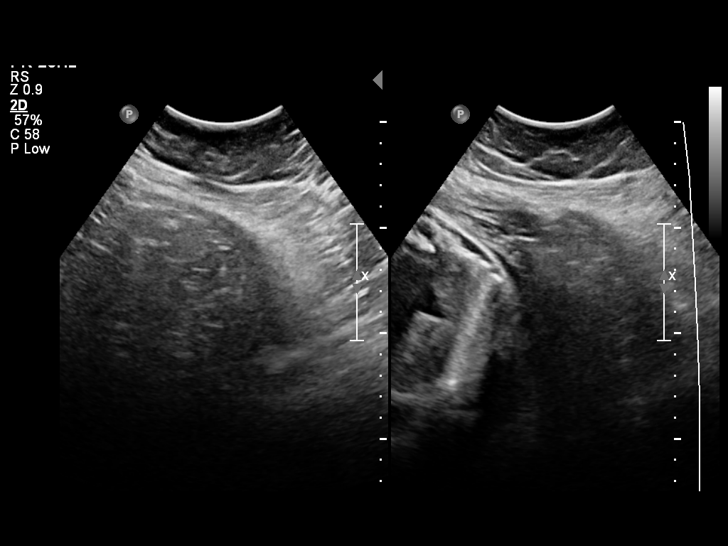
[im 66/81]
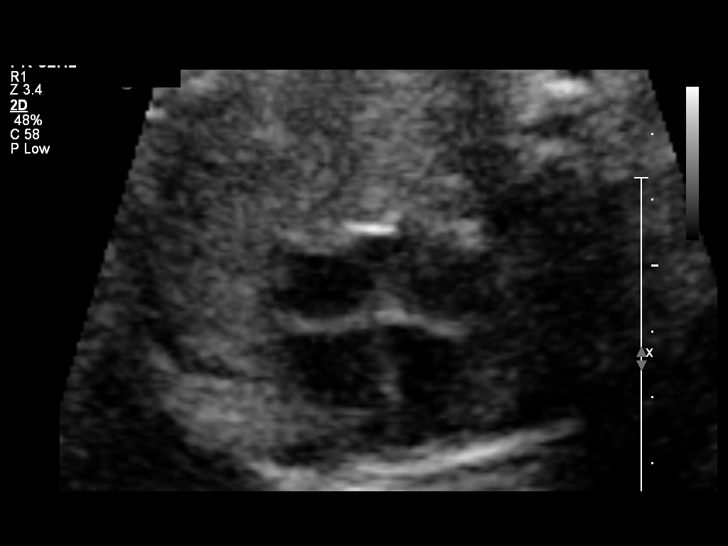
[im 72/81]
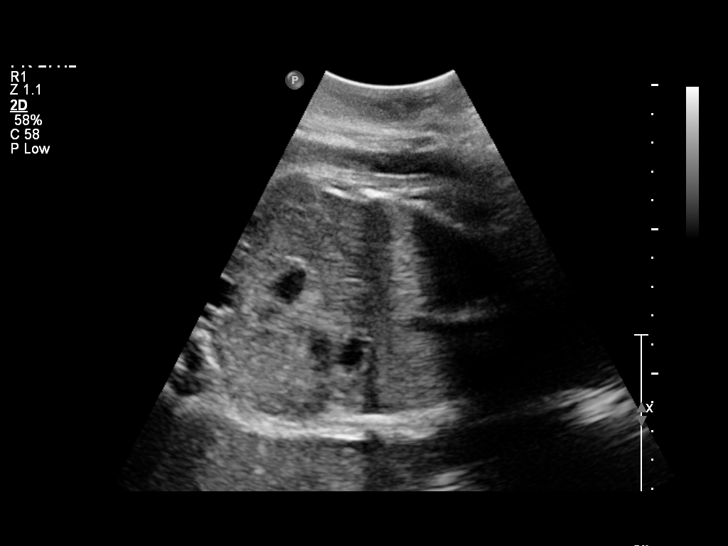
[im 78/81]
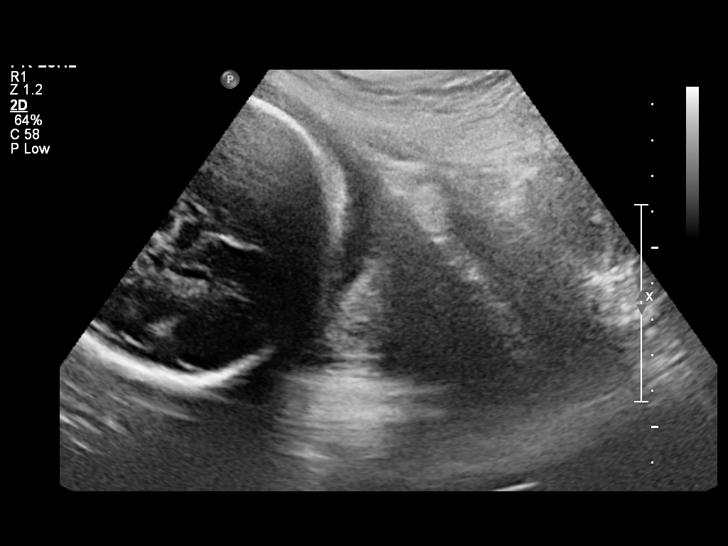

[12 of 28 positions shown; findings below may reference images not displayed]

OBSTETRICS REPORT
                      (Signed Final 05/17/2012 [DATE])

Service(s) Provided

 US OB DETAIL + 14 WK                                  76811.0
Indications

 Detailed fetal anatomic survey
 Hypertension - Chronic/Pre-existing
 History of cesarean delivery, currently pregnant      654.20,
 Poor obstetric history: Previous preeclampsia /
 eclampsia/gestational HTN
Fetal Evaluation

 Num Of Fetuses:    1
 Fetal Heart Rate:  138                         bpm
 Cardiac Activity:  Observed
 Presentation:      Cephalic
 Placenta:          Posterior Fundal, above
                    cervical os
 P. Cord            Visualized, central
 Insertion:

 Amniotic Fluid
 AFI FV:      Subjectively within normal limits
 AFI Sum:     18.81   cm      72   %Tile     Larg Pckt:   7.67   cm
 RUQ:   5.01   cm    RLQ:    7.67   cm    LUQ:   2.28    cm   LLQ:    3.85   cm
Biometry

 BPD:     79.3  mm    G. Age:   31w 6d                CI:        73.28   70 - 86
                                                      FL/HC:      19.3   19.2 -

 HC:     294.4  mm    G. Age:   32w 4d       89  %    HC/AC:      1.02   0.99 -

 AC:     288.3  mm    G. Age:   32w 6d     > 97  %    FL/BPD:     71.8   71 - 87
 FL:      56.9  mm    G. Age:   29w 6d       40  %    FL/AC:      19.7   20 - 24
 HUM:       57  mm    G. Age:   33w 1d     > 95  %
 Est. FW:    9292  gm      4 lb 1 oz     82  %
Gestational Age

 U/S Today:     31w 6d                                        EDD:   07/13/12
 Best:          29w 5d    Det. By:   Early Ultrasound         EDD:   07/28/12
Anatomy
 Cranium:          Appears normal         Aortic Arch:      Not well visualized
 Fetal Cavum:      Appears normal         Ductal Arch:      Not well visualized
 Ventricles:       Appears normal         Diaphragm:        Appears normal
 Choroid Plexus:   Appears normal         Stomach:          Appears normal
 Cerebellum:       Appears normal         Abdomen:          Appears normal
 Posterior Fossa:  Appears normal         Abdominal Wall:   Appears nml (cord
                                                            insert, abd wall)
 Nuchal Fold:      Not applicable (>20    Cord Vessels:     Appears normal (3
                   wks GA)                                  vessel cord)
 Face:             Orbits appear          Kidneys:          Appear normal
                   normal
 Lips:             Appears normal         Bladder:          Appears normal
 Heart:            Appears normal         Spine:            Appears normal
                   (4CH, axis, and
                   situs)
 RVOT:             Not well visualized    Lower             Appears normal
                                          Extremities:
 LVOT:             Not well visualized    Upper             Appears normal
                                          Extremities:

 Other:  Fetus appears to be a male. Heels visualized. Technically difficult due
         to advanced GA, maternal habitus and fetal position.
Targeted Anatomy

 Fetal Central Nervous System
 Lat. Ventricles:
Cervix Uterus Adnexa

 Cervical Length:   3.37      cm

 Cervix:       Normal appearance by transabdominal scan.
 Uterus:       No abnormality visualized.
 Cul De Sac:   No free fluid seen.
 Left Ovary:   Not visualized.
 Right Ovary:  Not visualized.

 Adnexa:     No abnormality visualized.
Impression

 Assigned GA is currently 29w 5d.   Upward trend of fetal
 growth curve, with EFW currently at  82 %ile.
 Suboptimal visualization of anatomy due to advanced GA,
 however no fetal anomalies seen involving visualized
 anatomy.
 Amniotic fluid within normal limits, with AFI of 18.81 cm.
 Normal cervical length.
Recommendations

 Consider continued US follow-up of fetal growth in 4-6 weeks.

 questions or concerns.

## 2014-03-12 ENCOUNTER — Ambulatory Visit (INDEPENDENT_AMBULATORY_CARE_PROVIDER_SITE_OTHER): Payer: 59 | Admitting: Family Medicine

## 2014-03-12 ENCOUNTER — Encounter: Payer: Self-pay | Admitting: Family Medicine

## 2014-03-12 VITALS — BP 138/87 | HR 99 | Temp 98.8°F | Ht 62.0 in | Wt 314.0 lb

## 2014-03-12 DIAGNOSIS — J189 Pneumonia, unspecified organism: Secondary | ICD-10-CM | POA: Insufficient documentation

## 2014-03-12 MED ORDER — AZITHROMYCIN 250 MG PO TABS: ORAL_TABLET | ORAL | Status: DC

## 2014-03-12 MED ORDER — BENZONATATE 200 MG PO CAPS: 200.0000 mg | ORAL_CAPSULE | Freq: Two times a day (BID) | ORAL | Status: DC | PRN

## 2014-03-12 NOTE — Patient Instructions (Signed)
Take tessalon as needed for cough and azithromycin for the next 5 days. If you are not feeling better please come back to the clinic or go to the urgent care or ER.

## 2014-03-13 NOTE — Assessment & Plan Note (Signed)
Due to duration and worsening will Rx abx and symptomatic treatment. Return criteria reviewed in detail.

## 2014-03-13 NOTE — Progress Notes (Signed)
Patient ID: Joan DredgeRaven S Simoni, female   DOB: August 22, 1990, 23 y.o.   MRN: [id number]   Subjective:  Joan Moore is a 23 y.o. female here for same day appointment for cough.  She reports a cough that began about 14 days ago and was resolving but worsened over the past week with onset of yellow thick sputum production and low grade fevers at home. She has also developed a sore throat during the last week. She has tried OTC cough medications with no efficacy. Denies myalgias, but has had fatigue. Has not received flu shot and declines it today.   She is a current everyday smoker. No sick contacts. No history of GERD.   All other pertinent systems reviewed and are negative. Objective:  BP 138/87  Pulse 99  Temp(Src) 98.8 F (37.1 C) (Oral)  Ht 5\' 2"  (1.575 m)  Wt 314 lb (142.429 kg)  BMI 57.42 kg/m2  Gen: 23 y.o. female in NAD with notable cigarette smell.  HEENT: MMM, EOMI, PERRL, TMs wnl bilaterally, no tonsillomegaly. Oropharynx erythematous without post nasal drip.  CV: RRR, no murmur Resp: Non-labored, distant breath sounds with scattered rhonchi but no focal consolidation, no wheezes noted Assessment & Plan:  Joan Moore is a 23 y.o. female with:  Problem List Items Addressed This Visit     Respiratory   CAP (community acquired pneumonia) - Primary     Due to duration and worsening will Rx abx and symptomatic treatment. Return criteria reviewed in detail.     Relevant Medications      azithromycin (ZITHROMAX) tablet      benzonatate (TESSALON) capsule

## 2014-03-26 ENCOUNTER — Ambulatory Visit (INDEPENDENT_AMBULATORY_CARE_PROVIDER_SITE_OTHER): Payer: 59 | Admitting: Family Medicine

## 2014-03-26 ENCOUNTER — Encounter: Payer: Self-pay | Admitting: Family Medicine

## 2014-03-26 VITALS — BP 157/85 | HR 75 | Temp 98.6°F | Wt 318.0 lb

## 2014-03-26 DIAGNOSIS — Z309 Encounter for contraceptive management, unspecified: Secondary | ICD-10-CM

## 2014-03-26 DIAGNOSIS — Z30012 Encounter for prescription of emergency contraception: Secondary | ICD-10-CM | POA: Insufficient documentation

## 2014-03-26 DIAGNOSIS — Z789 Other specified health status: Secondary | ICD-10-CM | POA: Insufficient documentation

## 2014-03-26 MED ORDER — LEVONORGESTREL 0.75 MG PO TABS: 0.7500 mg | ORAL_TABLET | Freq: Two times a day (BID) | ORAL | Status: DC

## 2014-03-26 NOTE — Patient Instructions (Signed)
Ms Joan Moore it was great to meet you today!  Please obtain plan B medicine from the pharmacy Let us know should you wish to have a pregnancy test performed   Additionally please let us know if you should want testing for STDs  See you soon! Charlane FerrettiMelanie C Keisy Strickler, MD

## 2014-03-26 NOTE — Progress Notes (Signed)
Patient ID: Joan Moore, female   DOB: [birth year], 23 y.o.   MRN: [id number]   [facility] Charlane FerrettiMelanie C Jene Huq, MD Phone: [phone]  Subjective:  Joan Moore is a 23 y.o F who [facility]  # Desires plan B -had a sexual encounter yesterday morning -pt not clearabout last depo injection? -was confused bc she had migraine cocktail a few months ago which included- 40 mg Depo-Medrol, 25 mg Phenergan, 30 mg Toradol all IM -Sunday morning had condom get stuck inside her  All relevant systems were reviewed and were negative unless otherwise noted in the HPI  Past Medical History Reviewed problem list.  Medications- reviewed and updated Current Outpatient Prescriptions  Medication Sig Dispense Refill  . azithromycin (ZITHROMAX) 250 MG tablet Take 2 tabs by mouth today then 1 tab by mouth daily  6 tablet  0  . benzonatate (TESSALON) 200 MG capsule Take 1 capsule (200 mg total) by mouth 2 (two) times daily as needed for cough.  20 capsule  0  . cyclobenzaprine (FLEXERIL) 5 MG tablet Take 1 tablet (5 mg total) by mouth 3 (three) times daily as needed for muscle spasms.  30 tablet  1  . hydrochlorothiazide (HYDRODIURIL) 25 MG tablet Take 1 tablet (25 mg total) by mouth daily.  90 tablet  3  . levonorgestrel (PLAN B,NEXT CHOICE) 0.75 MG tablet Take 1 tablet (0.75 mg total) by mouth every 12 (twelve) hours.  2 tablet  0  . polyethylene glycol powder (GLYCOLAX/MIRALAX) powder Take 17 g by mouth daily as needed (constipation).  3350 g  1  . SUMAtriptan (IMITREX) 50 MG tablet Take 1 tablet (50 mg total) by mouth for headache. May repeat dose once in 2 hours. Do not take more than 200mg  in 24 hours.  14 tablet  0   No current facility-administered medications for this visit.   Chief complaint-noted No additions to family history Social history- patient is a former smoker  Objective: BP 157/85  Pulse 75  Temp(Src) 98.6 F (37 C) (Oral)  Wt 318 lb (144.244 kg)  LMP  02/13/2014  Breastfeeding? No Gen: NAD, alert, cooperative with exam Neuro: Alert and oriented, No gross deficits Skin: no rashes no lesions  Assessment/Plan: See problem based a/p

## 2014-03-26 NOTE — Assessment & Plan Note (Signed)
Patient under the impression that she had birth control coverage bc she got Depo (MEDROL) injection for headache Denies interest in upreg Denies interest in STD testing  Will send for plan B (although she could get OTC med) RTC for further management

## 2014-04-06 ENCOUNTER — Encounter (HOSPITAL_COMMUNITY): Payer: Self-pay | Admitting: Emergency Medicine

## 2014-04-06 ENCOUNTER — Other Ambulatory Visit (HOSPITAL_COMMUNITY)
Admission: RE | Admit: 2014-04-06 | Discharge: 2014-04-06 | Disposition: A | Discharge status: Home / Self Care | Payer: 59 | Source: Ambulatory Visit | Attending: Emergency Medicine | Admitting: Emergency Medicine

## 2014-04-06 ENCOUNTER — Emergency Department (INDEPENDENT_AMBULATORY_CARE_PROVIDER_SITE_OTHER)
Admission: EM | Admit: 2014-04-06 | Discharge: 2014-04-06 | Disposition: A | Discharge status: Home / Self Care | Payer: 59 | Source: Home / Self Care | Attending: Emergency Medicine | Admitting: Emergency Medicine

## 2014-04-06 DIAGNOSIS — Z113 Encounter for screening for infections with a predominantly sexual mode of transmission: Secondary | ICD-10-CM | POA: Insufficient documentation

## 2014-04-06 DIAGNOSIS — N760 Acute vaginitis: Secondary | ICD-10-CM | POA: Insufficient documentation

## 2014-04-06 DIAGNOSIS — N390 Urinary tract infection, site not specified: Secondary | ICD-10-CM

## 2014-04-06 LAB — POCT PREGNANCY, URINE: Preg Test, Ur: NEGATIVE

## 2014-04-06 LAB — POCT URINALYSIS DIP (DEVICE)
BILIRUBIN URINE: NEGATIVE
GLUCOSE, UA: NEGATIVE mg/dL
Hgb urine dipstick: NEGATIVE
Ketones, ur: NEGATIVE mg/dL
NITRITE: NEGATIVE
Protein, ur: NEGATIVE mg/dL
Specific Gravity, Urine: 1.02 (ref 1.005–1.030)
Urobilinogen, UA: 0.2 mg/dL (ref 0.0–1.0)
pH: 6.5 (ref 5.0–8.0)

## 2014-04-06 MED ORDER — HYDROCODONE-ACETAMINOPHEN 5-325 MG PO TABS
ORAL_TABLET | ORAL | Status: AC
  Filled 2014-04-06: qty 1

## 2014-04-06 MED ORDER — HYDROCODONE-ACETAMINOPHEN 5-325 MG PO TABS
2.0000 | ORAL_TABLET | Freq: Once | ORAL | Status: AC
  Administered 2014-04-06: 2 via ORAL

## 2014-04-06 MED ORDER — CEPHALEXIN 500 MG PO CAPS: 500.0000 mg | ORAL_CAPSULE | Freq: Three times a day (TID) | ORAL | Status: DC

## 2014-04-06 MED ORDER — HYDROCODONE-ACETAMINOPHEN 5-325 MG PO TABS: ORAL_TABLET | ORAL | Status: DC

## 2014-04-06 MED ORDER — METRONIDAZOLE 500 MG PO TABS: 500.0000 mg | ORAL_TABLET | Freq: Two times a day (BID) | ORAL | Status: DC

## 2014-04-06 NOTE — ED Notes (Signed)
C/o white vag d/c and UTI sx onset 2 weeks Sx include: urinary freq, abd pain, nauseas Denies fevers, chills Alert, no signs of acute distress.

## 2014-04-06 NOTE — ED Notes (Signed)
Mother came and p/u pt.

## 2014-04-06 NOTE — ED Notes (Signed)
Waiting on mother to p/u pt.

## 2014-04-06 NOTE — Discharge Instructions (Signed)
Urinary Tract Infection Urinary tract infections (UTIs) can develop anywhere along your urinary tract. Your urinary tract is your body's drainage system for removing wastes and extra water. Your urinary tract includes two kidneys, two ureters, a bladder, and a urethra. Your kidneys are a pair of bean-shaped organs. Each kidney is about the size of your fist. They are located below your ribs, one on each side of your spine. CAUSES Infections are caused by microbes, which are microscopic organisms, including fungi, viruses, and bacteria. These organisms are so small that they can only be seen through a microscope. Bacteria are the microbes that most commonly cause UTIs. SYMPTOMS  Symptoms of UTIs may vary by age and gender of the patient and by the location of the infection. Symptoms in young women typically include a frequent and intense urge to urinate and a painful, burning feeling in the bladder or urethra during urination. Older women and men are more likely to be tired, shaky, and weak and have muscle aches and abdominal pain. A fever may mean the infection is in your kidneys. Other symptoms of a kidney infection include pain in your back or sides below the ribs, nausea, and vomiting. DIAGNOSIS To diagnose a UTI, your caregiver will ask you about your symptoms. Your caregiver also will ask to provide a urine sample. The urine sample will be tested for bacteria and white blood cells. White blood cells are made by your body to help fight infection. TREATMENT  Typically, UTIs can be treated with medication. Because most UTIs are caused by a bacterial infection, they usually can be treated with the use of antibiotics. The choice of antibiotic and length of treatment depend on your symptoms and the type of bacteria causing your infection. HOME CARE INSTRUCTIONS  If you were prescribed antibiotics, take them exactly as your caregiver instructs you. Finish the medication even if you feel better after you  have only taken some of the medication.  Drink enough water and fluids to keep your urine clear or pale yellow.  Avoid caffeine, tea, and carbonated beverages. They tend to irritate your bladder.  Empty your bladder often. Avoid holding urine for long periods of time.  Empty your bladder before and after sexual intercourse.  After a bowel movement, women should cleanse from front to back. Use each tissue only once. SEEK MEDICAL CARE IF:   You have back pain.  You develop a fever.  Your symptoms do not begin to resolve within 3 days. SEEK IMMEDIATE MEDICAL CARE IF:   You have severe back pain or lower abdominal pain.  You develop chills.  You have nausea or vomiting.  You have continued burning or discomfort with urination. MAKE SURE YOU:   Understand these instructions.  Will watch your condition.  Will get help right away if you are not doing well or get worse. Document Released: 03/04/2005 Document Revised: 11/24/2011 Document Reviewed: 07/03/2011 ExitCare Patient Information 2015 [employer]. This information is not intended to replace advice given to you by your health care provider. Make sure you discuss any questions you have with your health care provider. Vaginitis Vaginitis is an inflammation of the vagina. It is most often caused by a change in the normal balance of the bacteria and yeast that live in the vagina. This change in balance causes an overgrowth of certain bacteria or yeast, which causes the inflammation. There are different types of vaginitis, but the most common types are:  Bacterial vaginosis.  Yeast infection (candidiasis).  Trichomoniasis   vaginitis. This is a sexually transmitted infection (STI).  Viral vaginitis.  Atropic vaginitis.  Allergic vaginitis. CAUSES  The cause depends on the type of vaginitis. Vaginitis can be caused by:  Bacteria (bacterial vaginosis).  Yeast (yeast infection).  A parasite (trichomoniasis  vaginitis)  A virus (viral vaginitis).  Low hormone levels (atrophic vaginitis). Low hormone levels can occur during pregnancy, breastfeeding, or after menopause.  Irritants, such as bubble baths, scented tampons, and feminine sprays (allergic vaginitis). Other factors can change the normal balance of the yeast and bacteria that live in the vagina. These include:  Antibiotic medicines.  Poor hygiene.  Diaphragms, vaginal sponges, spermicides, birth control pills, and intrauterine devices (IUD).  Sexual intercourse.  Infection.  Uncontrolled diabetes.  A weakened immune system. SYMPTOMS  Symptoms can vary depending on the cause of the vaginitis. Common symptoms include:  Abnormal vaginal discharge.  The discharge is white, gray, or yellow with bacterial vaginosis.  The discharge is thick, white, and cheesy with a yeast infection.  The discharge is frothy and yellow or greenish with trichomoniasis.  A bad vaginal odor.  The odor is fishy with bacterial vaginosis.  Vaginal itching, pain, or swelling.  Painful intercourse.  Pain or burning when urinating. Sometimes, there are no symptoms. TREATMENT  Treatment will vary depending on the type of infection.   Bacterial vaginosis and trichomoniasis are often treated with antibiotic creams or pills.  Yeast infections are often treated with antifungal medicines, such as vaginal creams or suppositories.  Viral vaginitis has no cure, but symptoms can be treated with medicines that relieve discomfort. Your sexual partner should be treated as well.  Atrophic vaginitis may be treated with an estrogen cream, pill, suppository, or vaginal ring. If vaginal dryness occurs, lubricants and moisturizing creams may help. You may be told to avoid scented soaps, sprays, or douches.  Allergic vaginitis treatment involves quitting the use of the product that is causing the problem. Vaginal creams can be used to treat the symptoms. HOME  CARE INSTRUCTIONS   Take all medicines as directed by your caregiver.  Keep your genital area clean and dry. Avoid soap and only rinse the area with water.  Avoid douching. It can remove the healthy bacteria in the vagina.  Do not use tampons or have sexual intercourse until your vaginitis has been treated. Use sanitary pads while you have vaginitis.  Wipe from front to back. This avoids the spread of bacteria from the rectum to the vagina.  Let air reach your genital area.  Wear cotton underwear to decrease moisture buildup.  Avoid wearing underwear while you sleep until your vaginitis is gone.  Avoid tight pants and underwear or nylons without a cotton panel.  Take off wet clothing (especially bathing suits) as soon as possible.  Use mild, non-scented products. Avoid using irritants, such as:  Scented feminine sprays.  Fabric softeners.  Scented detergents.  Scented tampons.  Scented soaps or bubble baths.  Practice safe sex and use condoms. Condoms may prevent the spread of trichomoniasis and viral vaginitis. SEEK MEDICAL CARE IF:   You have abdominal pain.  You have a fever or persistent symptoms for more than 2-3 days.  You have a fever and your symptoms suddenly get worse. Document Released: 03/22/2007 Document Revised: 02/17/2012 Document Reviewed: 11/05/2011 ExitCare Patient Information 2015 [employer]. This information is not intended to replace advice given to you by your health care provider. Make sure you discuss any questions you have with your health care provider.  

## 2014-04-06 NOTE — ED Provider Notes (Signed)
Chief Complaint   Vaginal Discharge and Urinary Tract Infection   History of Present Illness   Joan Moore is a 23 year old female who has had a two-week history of urinary symptoms and vaginal discharge. The patient describes urinary frequency, urgency, bladder irritation, and malodorous urine. She's had pain in her lower abdomen, lower back, and right flank. She describes a temperature of up to 102 last night and some nausea but no vomiting. She has had urinary tract infections and stones in the past. She also had a two-week history of vaginal discharge and odor. She denies any itching. She's had pain with intercourse. Last menstrual period was October 8. She is sexually active with use of condoms. The patient had intercourse on October 18, the condom broke, and this was about the time of her ovulation so she took a plan B. She has a history of bacterial vaginosis.  Review of Systems   Other than as noted above, the patient denies any of the following symptoms: General:  No fevers or chills. GI:  No abdominal pain, back pain, nausea, or vomiting. GU:  No hematuria or incontinence. GYN:  No discharge, itching, vulvar pain or lesions, pelvic pain, or abnormal vaginal bleeding.  PMFSH   Past medical history, family history, social history, meds, and allergies were reviewed.    Physical Examination     Vital signs:  BP 133/88  Pulse 95  Temp(Src) 99 F (37.2 C) (Oral)  Resp 18  SpO2 100%  LMP 02/13/2014  Breastfeeding? No Gen:  Alert, oriented, in no distress. Lungs:  Clear to auscultation, no wheezes, rales or rhonchi. Heart:  Regular rhythm, no gallop or murmer. Abdomen:  Flat and soft. There was slight suprapubic pain to palpation and slight right flank tenderness to palpation.  No guarding, or rebound.  No hepato-splenomegaly or mass.  Bowel sounds were normally active.  No hernia. Pelvic exam:  Normal external genitalia. Vaginal and cervical mucosa were normal. There was a  small amount of white discharge. No pain on cervical motion. Uterus was normal in size and shape and nontender. No adnexal masses or tenderness.  DNA probes for gonorrhea, Chlamydia, Trichomonas, Gardnerella, and Candida were obtained. Back:  She has mild right CVA tenderness.  Skin:  Clear, warm and dry.  Labs   Results for orders placed during the hospital encounter of 04/06/14  POCT PREGNANCY, URINE      Result Value Ref Range   Preg Test, Ur NEGATIVE  NEGATIVE  POCT URINALYSIS DIP (DEVICE)      Result Value Ref Range   Glucose, UA NEGATIVE  NEGATIVE mg/dL   Bilirubin Urine NEGATIVE  NEGATIVE   Ketones, ur NEGATIVE  NEGATIVE mg/dL   Specific Gravity, Urine 1.020  1.005 - 1.030   Hgb urine dipstick NEGATIVE  NEGATIVE   pH 6.5  5.0 - 8.0   Protein, ur NEGATIVE  NEGATIVE mg/dL   Urobilinogen, UA 0.2  0.0 - 1.0 mg/dL   Nitrite NEGATIVE  NEGATIVE   Leukocytes, UA SMALL (*) NEGATIVE     A urine culture was obtained.  Results are pending at this time and we will call about any positive results.  Assessment   The primary encounter diagnosis was UTI (lower urinary tract infection). A diagnosis of Vaginitis was also pertinent to this visit.   I don't think she has a low nephritis despite a history of a fever yesterday. She is afebrile today and has no vomiting.  Plan   1.  Meds:  The following meds were prescribed:   Discharge Medication List as of 04/06/2014  7:47 PM    START taking these medications   Details  cephALEXin (KEFLEX) 500 MG capsule Take 1 capsule (500 mg total) by mouth 3 (three) times daily., Starting 04/06/2014, Until Discontinued, Normal    HYDROcodone-acetaminophen (NORCO/VICODIN) 5-325 MG per tablet 1 to 2 tabs every 4 to 6 hours as needed for pain., Print    metroNIDAZOLE (FLAGYL) 500 MG tablet Take 1 tablet (500 mg total) by mouth 2 (two) times daily., Starting 04/06/2014, Until Discontinued, Normal        2.  Patient Education/Counseling:  The patient  was given appropriate handouts, self care instructions, and instructed in symptomatic relief. The patient was told to avoid intercourse for 10 days, get extra fluids, and return for a follow up with her primary care doctor at the completion of treatment for a repeat UA and culture.    3.  Follow up:  The patient was told to follow up here if no better in 3 to 4 days, or sooner if becoming worse in any way, and given some red flag symptoms such as fever, persistent vomiting, or severe flank or abdominal pain which would prompt immediate return.     Reuben Likesavid C Yee Joss, MD 04/06/14 [phone]

## 2014-04-08 LAB — URINE CULTURE: Special Requests: NORMAL

## 2014-04-09 ENCOUNTER — Encounter (HOSPITAL_COMMUNITY): Payer: Self-pay | Admitting: Emergency Medicine

## 2014-04-09 LAB — CERVICOVAGINAL ANCILLARY ONLY
Chlamydia: NEGATIVE
Neisseria Gonorrhea: NEGATIVE

## 2014-04-10 LAB — CERVICOVAGINAL ANCILLARY ONLY
WET PREP (BD AFFIRM): POSITIVE — AB
WET PREP (BD AFFIRM): POSITIVE — AB
Wet Prep (BD Affirm): NEGATIVE

## 2014-04-11 ENCOUNTER — Telehealth (HOSPITAL_COMMUNITY): Payer: Self-pay | Admitting: *Deleted

## 2014-04-11 NOTE — ED Notes (Addendum)
GC/Chlamydia neg., Affirm: Candida neg., Gardnerella and Trich pos. Pt. adequately treated with Flagyl.  I called pt. and left a message to call. Call 1. Joan Moore, Joan Moore 04/11/2014 Pt. called back on VM. I called pt. Call 2.  Pt. verified x 2 and given results.  Pt. told she was adequately treated for both infections with Flagyl.  Pt. instructed to notify her partner to be treated with Flagyl for Trich, no sex until she has finished her medication and her partner has been treated and to practice safe sex. Pt. told that she can get HIV testing at the [facility], by appointment.

## 2014-04-25 ENCOUNTER — Ambulatory Visit: Payer: 59 | Admitting: Family Medicine

## 2014-04-25 ENCOUNTER — Ambulatory Visit (INDEPENDENT_AMBULATORY_CARE_PROVIDER_SITE_OTHER): Payer: 59 | Admitting: Family Medicine

## 2014-04-25 VITALS — BP 153/94 | HR 98 | Temp 98.6°F | Wt 316.0 lb

## 2014-04-25 DIAGNOSIS — R1031 Right lower quadrant pain: Secondary | ICD-10-CM

## 2014-04-25 DIAGNOSIS — R1030 Lower abdominal pain, unspecified: Secondary | ICD-10-CM

## 2014-04-25 LAB — POCT UA - MICROSCOPIC ONLY

## 2014-04-25 LAB — POCT URINALYSIS DIPSTICK
Bilirubin, UA: NEGATIVE
Glucose, UA: NEGATIVE
Leukocytes, UA: NEGATIVE
Nitrite, UA: NEGATIVE
PROTEIN UA: 30
Spec Grav, UA: 1.025
UROBILINOGEN UA: 0.2
pH, UA: 6

## 2014-04-25 LAB — POCT URINE PREGNANCY: PREG TEST UR: NEGATIVE

## 2014-04-25 NOTE — Patient Instructions (Signed)
Abdominal Pain Treatment - you should: work on the exercises we went over. Continue the over the counter pain meds, you can also try icing and heating the area You should be better in: up to a month or longer Call us or go to the ER if you have severe pain or bleeding or high fever  Come back to see us in about 1 month, or sooner if pain gets worse or more consistent

## 2014-04-25 NOTE — Assessment & Plan Note (Signed)
Somewhat atypical presentation with specific radiation down femoral nerve distribution. No history of trauma, does have history of recent BV and trich (treated) and c-sections. Based on exam believe she may be having inflammation of the femoral nerve piercing the psoas major. Not an acute abdomen; pain is actually already improving. Plan: OTC analgesics, went over a few exercises and handout given for psoas muscle stretching.

## 2014-04-25 NOTE — Progress Notes (Signed)
   Subjective:    Patient ID: Joan Moore, female    DOB: Feb 16, 1991, 23 y.o.   MRN: [id number]  HPI  Patient presents for Same Day Appointment  CC: Abdominal and right leg pain  ABDOMINAL PAIN  Abdominal pain is right sided, present on and off (1 week on, 1 week off), acutely worse during sleeping this morning and now seems to radiate down into her right leg (front/medial) down to her knee. Not made worse with anything in particular. Pain is improving throughout the day. No weakness or numbness of the leg. Pain in leg and abdomen are mostly sharp.  Pain began 1 days ago, present since she was given plan B in October but got worse at 3am this morning Medications tried: none Similar pain before: yes Prior abdominal surgeries: 2 prior c-sections  Symptoms Nausea/vomiting: nausea Diarrhea: no, but has been having more loose stools (up to 5 a day) Constipation: no Blood in stool: no Blood in vomit: no Fever: yes, 102F on 10/29 Dysuria: no Loss of appetite: no Weight loss: no  Vaginal Bleeding: 2 days last week, 1 week later than normal period Missed menstrual period: no   Review of Systems   See HPI for ROS. All other systems reviewed and are negative.  Past medical history, surgical, family, and social history reviewed and updated in the EMR as appropriate. Smoking status noted.   Objective:  BP 153/94 mmHg  Pulse 98  Temp(Src) 98.6 F (37 C) (Oral)  Wt 316 lb (143.337 kg)  LMP 03/15/2014  Breastfeeding? No Vitals reviewed  General: NAD CV: RRR, normal s1s2 no mrg Resp: CTAB Abdomen: obese, soft, mild tenderness to RLQ no rebound or guarding. Negative rosvings, heel jar.  Ext: hip flexion and internal rotation illicits pain.   Assessment & Plan:  See Problem List Documentation

## 2014-07-01 ENCOUNTER — Encounter (HOSPITAL_COMMUNITY): Payer: Self-pay

## 2014-07-01 ENCOUNTER — Inpatient Hospital Stay (HOSPITAL_COMMUNITY)
Admission: AD | Admit: 2014-07-01 | Discharge: 2014-07-02 | Disposition: A | Discharge status: Home / Self Care | Payer: 59 | Source: Ambulatory Visit | Attending: Obstetrics and Gynecology | Admitting: Obstetrics and Gynecology

## 2014-07-01 DIAGNOSIS — Z87891 Personal history of nicotine dependence: Secondary | ICD-10-CM | POA: Diagnosis not present

## 2014-07-01 DIAGNOSIS — I10 Essential (primary) hypertension: Secondary | ICD-10-CM | POA: Insufficient documentation

## 2014-07-01 DIAGNOSIS — M545 Low back pain, unspecified: Secondary | ICD-10-CM

## 2014-07-01 LAB — URINALYSIS, ROUTINE W REFLEX MICROSCOPIC
BILIRUBIN URINE: NEGATIVE
Glucose, UA: NEGATIVE mg/dL
KETONES UR: NEGATIVE mg/dL
Leukocytes, UA: NEGATIVE
Nitrite: NEGATIVE
PH: 6 (ref 5.0–8.0)
Protein, ur: NEGATIVE mg/dL
Specific Gravity, Urine: >1.03 — ABNORMAL HIGH (ref 1.005–1.030)
UROBILINOGEN UA: 0.2 mg/dL (ref 0.0–1.0)

## 2014-07-01 LAB — URINE MICROSCOPIC-ADD ON

## 2014-07-01 LAB — CBC
HEMATOCRIT: 36.4 % (ref 36.0–46.0)
Hemoglobin: 11.7 g/dL — ABNORMAL LOW (ref 12.0–15.0)
MCH: 27.1 pg (ref 26.0–34.0)
MCHC: 32.1 g/dL (ref 30.0–36.0)
MCV: 84.5 fL (ref 78.0–100.0)
Platelets: 315 10*3/uL (ref 150–400)
RBC: 4.31 MIL/uL (ref 3.87–5.11)
RDW: 13.5 % (ref 11.5–15.5)
WBC: 7.7 10*3/uL (ref 4.0–10.5)

## 2014-07-01 LAB — POCT PREGNANCY, URINE: Preg Test, Ur: NEGATIVE

## 2014-07-01 LAB — WET PREP, GENITAL
Clue Cells Wet Prep HPF POC: NONE SEEN
Trich, Wet Prep: NONE SEEN
WBC WET PREP: NONE SEEN
YEAST WET PREP: NONE SEEN

## 2014-07-01 MED ORDER — KETOROLAC TROMETHAMINE 60 MG/2ML IM SOLN
60.0000 mg | Freq: Once | INTRAMUSCULAR | Status: AC
  Administered 2014-07-02: 60 mg via INTRAMUSCULAR
  Filled 2014-07-01: qty 2

## 2014-07-01 NOTE — MAU Note (Signed)
Pt states for the last 4 days she has been having a discomfort in her intestines and then she began having pain in her kidney area on the left. States the pain is now in the back and front on both sides. Also reports shortness of breath and nausea with the pain.

## 2014-07-01 NOTE — MAU Provider Note (Signed)
History     CSN: [id number]  Arrival date and time: 07/01/14 2245   First Provider Initiated Contact with Patient 07/01/14 2336      Chief Complaint  Patient presents with  . Abdominal Pain  . Back Pain  . Nausea   HPI  Joan Moore is a 24 y.o. [id number] who presents today with lower back and kidney pain. She states that about 5 days ago she felt like she was starting to get a UTI, and then yesterday it became worse. She states that she took a hydrocodone at 2100, but it has not helped with the pain. She rates pain currently 10/10. She denies any fever or vomiting. She has had some nausea. Her last BM was today.   Past Medical History  Diagnosis Date  . Hypertension   . Bipolar 1 disorder   . AOZHYQMV(784.6Headache(784.0)     Past Surgical History  Procedure Laterality Date  . Cesarean section    . Cesarean section N/A 07/16/2012    Procedure: Repeat cesarean section with delivery of baby  boy at 1535. Apgars 7/9.;  Surgeon: Willodean Rosenthalarolyn Harraway-Smith, MD;  Location: [facility];  Service: Obstetrics;  Laterality: N/A;    Family History  Problem Relation Age of Onset  . Other Neg Hx   . Diabetes Mother   . Hypertension Mother   . Diabetes Maternal Grandmother   . Hypertension Maternal Grandmother   . Diabetes Maternal Grandfather   . Hypertension Maternal Grandfather     History  Substance Use Topics  . Smoking status: Former Smoker -- 0.25 packs/day    Types: Cigarettes  . Smokeless tobacco: Never Used  . Alcohol Use: No     Comment: occasional    Allergies: No Known Allergies  Prescriptions prior to admission  Medication Sig Dispense Refill Last Dose  . HYDROcodone-acetaminophen (NORCO/VICODIN) 5-325 MG per tablet 1 to 2 tabs every 4 to 6 hours as needed for pain. 20 tablet 0 07/01/2014 at 2100    ROS Physical Exam   Blood pressure 144/98, pulse 84, temperature 98.6 F (37 C), temperature source Oral, resp. rate 20, height 5\' 2"  (1.575 m), weight 143.337 kg (316 lb), last  menstrual period 06/19/2014, SpO2 100 %, not currently breastfeeding.  Physical Exam  Nursing note and vitals reviewed. Constitutional: She is oriented to person, place, and time. She appears well-developed and well-nourished. No distress.  Cardiovascular: Normal rate.   Respiratory: Effort normal.  GI: Soft. There is no tenderness. There is no rebound and no guarding.  Genitourinary:  No CVA tenderness External: no lesion Vagina: small amount of white discharge Cervix: pink, smooth, no CMT Uterus: NSSC Adnexa: NT   Neurological: She is alert and oriented to person, place, and time.  Skin: Skin is warm and dry.  Psychiatric: She has a normal mood and affect.    MAU Course  Procedures  Results for orders placed or performed during the hospital encounter of 07/01/14 (from the past 24 hour(s))  Urinalysis, Routine w reflex microscopic     Status: Abnormal   Collection Time: 07/01/14 10:54 PM  Result Value Ref Range   Color, Urine YELLOW YELLOW   APPearance CLEAR CLEAR   Specific Gravity, Urine >1.030 (H) 1.005 - 1.030   pH 6.0 5.0 - 8.0   Glucose, UA NEGATIVE NEGATIVE mg/dL   Hgb urine dipstick TRACE (A) NEGATIVE   Bilirubin Urine NEGATIVE NEGATIVE   Ketones, ur NEGATIVE NEGATIVE mg/dL   Protein, ur NEGATIVE NEGATIVE mg/dL  Urobilinogen, UA 0.2 0.0 - 1.0 mg/dL   Nitrite NEGATIVE NEGATIVE   Leukocytes, UA NEGATIVE NEGATIVE  Urine microscopic-add on     Status: Abnormal   Collection Time: 07/01/14 10:54 PM  Result Value Ref Range   Squamous Epithelial / LPF RARE RARE   WBC, UA 0-2 <3 WBC/hpf   RBC / HPF 0-2 <3 RBC/hpf   Bacteria, UA FEW (A) RARE  Pregnancy, urine POC     Status: None   Collection Time: 07/01/14 11:13 PM  Result Value Ref Range   Preg Test, Ur NEGATIVE NEGATIVE  CBC     Status: Abnormal   Collection Time: 07/01/14 11:25 PM  Result Value Ref Range   WBC 7.7 4.0 - 10.5 K/uL   RBC 4.31 3.87 - 5.11 MIL/uL   Hemoglobin 11.7 (L) 12.0 - 15.0 g/dL   HCT  69.6 29.5 - 28.4 %   MCV 84.5 78.0 - 100.0 fL   MCH 27.1 26.0 - 34.0 pg   MCHC 32.1 30.0 - 36.0 g/dL   RDW 13.2 44.0 - 10.2 %   Platelets 315 150 - 400 K/uL  Wet prep, genital     Status: None   Collection Time: 07/01/14 11:46 PM  Result Value Ref Range   Yeast Wet Prep HPF POC NONE SEEN NONE SEEN   Trich, Wet Prep NONE SEEN NONE SEEN   Clue Cells Wet Prep HPF POC NONE SEEN NONE SEEN   WBC, Wet Prep HPF POC NONE SEEN NONE SEEN  0100: Pain improved with toradol, labs normal. Will DC home. UC pending  Assessment and Plan   1. Bilateral low back pain without sciatica    DC home UC, GC/CT pending Return to MAU as needed  Follow-up Information    Follow up with [facility]ERGENCY [facility]PT.   Specialty:  Emergency Medicine   Why:  If symptoms worsen   Contact information:   [address] [place] [id number] mc [place] [address] [phone]       Tawnya Crook 07/02/2014, 1:00 AM

## 2014-07-02 ENCOUNTER — Encounter (HOSPITAL_COMMUNITY): Payer: Self-pay | Admitting: Emergency Medicine

## 2014-07-02 ENCOUNTER — Emergency Department (HOSPITAL_COMMUNITY)
Admission: EM | Admit: 2014-07-02 | Discharge: 2014-07-02 | Disposition: A | Discharge status: Home / Self Care | Payer: 59 | Source: Home / Self Care | Attending: Emergency Medicine | Admitting: Emergency Medicine

## 2014-07-02 DIAGNOSIS — I10 Essential (primary) hypertension: Secondary | ICD-10-CM | POA: Insufficient documentation

## 2014-07-02 DIAGNOSIS — Z8659 Personal history of other mental and behavioral disorders: Secondary | ICD-10-CM

## 2014-07-02 DIAGNOSIS — R300 Dysuria: Secondary | ICD-10-CM

## 2014-07-02 DIAGNOSIS — Z87891 Personal history of nicotine dependence: Secondary | ICD-10-CM | POA: Insufficient documentation

## 2014-07-02 DIAGNOSIS — R1030 Lower abdominal pain, unspecified: Secondary | ICD-10-CM

## 2014-07-02 DIAGNOSIS — M545 Low back pain: Secondary | ICD-10-CM

## 2014-07-02 DIAGNOSIS — Z3202 Encounter for pregnancy test, result negative: Secondary | ICD-10-CM | POA: Insufficient documentation

## 2014-07-02 LAB — URINALYSIS, ROUTINE W REFLEX MICROSCOPIC
Bilirubin Urine: NEGATIVE
GLUCOSE, UA: NEGATIVE mg/dL
Hgb urine dipstick: NEGATIVE
Ketones, ur: NEGATIVE mg/dL
LEUKOCYTES UA: NEGATIVE
Nitrite: NEGATIVE
Protein, ur: NEGATIVE mg/dL
Specific Gravity, Urine: 1.011 (ref 1.005–1.030)
Urobilinogen, UA: 0.2 mg/dL (ref 0.0–1.0)
pH: 5.5 (ref 5.0–8.0)

## 2014-07-02 LAB — GC/CHLAMYDIA PROBE AMP (~~LOC~~) NOT AT ARMC
CHLAMYDIA, DNA PROBE: NEGATIVE
Neisseria Gonorrhea: NEGATIVE

## 2014-07-02 LAB — PREGNANCY, URINE: Preg Test, Ur: NEGATIVE

## 2014-07-02 MED ORDER — OXYCODONE-ACETAMINOPHEN 5-325 MG PO TABS
1.0000 | ORAL_TABLET | Freq: Once | ORAL | Status: AC
  Administered 2014-07-02: 1 via ORAL
  Filled 2014-07-02: qty 1

## 2014-07-02 NOTE — ED Notes (Signed)
Pt c/o abd pain since last Wed. Pt state that [facility] was [facility] and was "just given a shot for pain".  Pt states that she left side pain when she urinates.  Pt has PMH UTIs.

## 2014-07-02 NOTE — ED Provider Notes (Signed)
CSN: [id number]     Arrival date & time 07/02/14  1638 History   First MD Initiated Contact with Patient 07/02/14 1938     Chief Complaint  Patient presents with  . Abdominal Pain     (Consider location/radiation/quality/duration/timing/severity/associated sxs/prior Treatment) HPI  Pt presenting with c/o lower abdominal pain and burning with urination.  Pt states that symptoms have been ongoing for the past 2 days.  [facility]- she had a pelvic exam, labs, normal urine.  She states symptoms have remained constant.  She has hx of UTIs in the past.  No fever/chills.  No vomiting or change in bowels.  Denies vaginal bleeding.  Pt also adds in muliptle other complaints- there is  Spot on her right arm that has pain, bilateral leg pain, bilateral shoulder pain- these are all intermittent and ongoing over time- she states she has seen her PMD as well and ws given physical therapy exercises to do which are not helping.  There are no other associated systemic symptoms, there are no other alleviating or modifying factors.   Past Medical History  Diagnosis Date  . Hypertension   . Bipolar 1 disorder   . [id number](956.2Headache(784.0)    Past Surgical History  Procedure Laterality Date  . Cesarean section    . Cesarean section N/A 07/16/2012    Procedure: Repeat cesarean section with delivery of baby  boy at 1535. Apgars 7/9.;  Surgeon: Willodean Rosenthalarolyn Harraway-Smith, MD;  Location: [facility];  Service: Obstetrics;  Laterality: N/A;   Family History  Problem Relation Age of Onset  . Other Neg Hx   . Diabetes Mother   . Hypertension Mother   . Diabetes Maternal Grandmother   . Hypertension Maternal Grandmother   . Diabetes Maternal Grandfather   . Hypertension Maternal Grandfather    History  Substance Use Topics  . Smoking status: Former Smoker -- 0.25 packs/day    Types: Cigarettes  . Smokeless tobacco: Never Used  . Alcohol Use: No     Comment: occasional    OB History    Gravida Para Term Preterm AB TAB SAB Ectopic Multiple Living   2 2 2  0 0 0 0 0 0 2     Review of Systems  ROS reviewed and all otherwise negative except for mentioned in HPI    Allergies  Review of patient's allergies indicates no known allergies.  Home Medications   Prior to Admission medications   Medication Sig Start Date End Date Taking? Authorizing Provider  HYDROcodone-acetaminophen (NORCO/VICODIN) 5-325 MG per tablet 1 to 2 tabs every 4 to 6 hours as needed for pain. 04/06/14  Yes Reuben Likesavid C Keller, MD   BP 137/69 mmHg  Pulse 82  Temp(Src) 98.6 F (37 C) (Oral)  Resp 18  SpO2 97%  LMP 06/19/2014  Vitals reviewed Physical Exam  Physical Examination: General appearance - alert, well appearing, and in no distress Mental status - alert, oriented to person, place, and time Eyes - no conjunctival injection, no scleral icterus Mouth - mucous membranes moist, pharynx normal without lesions Chest - clear to auscultation, no wheezes, rales or rhonchi, symmetric air entry Heart - normal rate, regular rhythm, normal S1, S2, no murmurs, rubs, clicks or gallops Abdomen - soft, mild diffuse tenderness, nondistended, no masses or organomegaly Extremities - peripheral pulses normal, no pedal edema, no clubbing or cyanosis Skin - normal coloration and turgor, no rashes  ED Course  Procedures (including critical care time)  Labs Review Labs Reviewed  URINALYSIS, ROUTINE W REFLEX MICROSCOPIC  PREGNANCY, URINE    Imaging Review No results found.   EKG Interpretation None      MDM   Final diagnoses:  Lower abdominal pain  Dysuria   Pt presenting with ongoing lower abdominal pain and burning with urination.  Note reviewed from [facility] hospital- she had pelvic exam done then which was normal- genprobe results are negative.  Urine was negative from that visit as well.  Urine repeated tonight and reassuring.  Pt appears comfortable on exam.  Advised followup  with PMD.       Ethelda Chick, MD 07/03/14 [phone]

## 2014-07-02 NOTE — Discharge Instructions (Signed)

## 2014-07-02 NOTE — Discharge Instructions (Signed)
Return to the ED with any concerns including vomiting and not able to keep down liquids, fever/chills, worsening pain, decreased level of alertness/lethargy, or any other alarming symptoms °

## 2014-07-02 NOTE — ED Notes (Signed)
Pt resting quietly on stretcher, watching television.

## 2014-07-02 NOTE — ED Notes (Addendum)
Pt denies blood in urine and burning with urination. When entering the room, pt sitting up on stretcher using cell phone.

## 2014-07-03 LAB — URINE CULTURE
CULTURE: NO GROWTH
Colony Count: NO GROWTH

## 2014-07-03 LAB — HIV ANTIBODY (ROUTINE TESTING W REFLEX): HIV-1/HIV-2 Ab: NONREACTIVE

## 2014-07-05 ENCOUNTER — Encounter: Payer: Self-pay | Admitting: Family Medicine

## 2014-07-05 ENCOUNTER — Ambulatory Visit (INDEPENDENT_AMBULATORY_CARE_PROVIDER_SITE_OTHER): Payer: 59 | Admitting: Family Medicine

## 2014-07-05 VITALS — BP 118/70 | HR 85 | Temp 98.8°F | Resp 18 | Wt 316.0 lb

## 2014-07-05 DIAGNOSIS — R1030 Lower abdominal pain, unspecified: Secondary | ICD-10-CM

## 2014-07-05 DIAGNOSIS — R350 Frequency of micturition: Secondary | ICD-10-CM

## 2014-07-05 DIAGNOSIS — R109 Unspecified abdominal pain: Secondary | ICD-10-CM | POA: Insufficient documentation

## 2014-07-05 LAB — POCT URINALYSIS DIPSTICK
Bilirubin, UA: NEGATIVE
Glucose, UA: NEGATIVE
Ketones, UA: NEGATIVE
Leukocytes, UA: NEGATIVE
NITRITE UA: NEGATIVE
Spec Grav, UA: >=1.03
Urobilinogen, UA: 0.2
pH, UA: 6

## 2014-07-05 LAB — CBC
HCT: 36.1 % (ref 36.0–46.0)
Hemoglobin: 11.3 g/dL — ABNORMAL LOW (ref 12.0–15.0)
MCH: 26.4 pg (ref 26.0–34.0)
MCHC: 31.3 g/dL (ref 30.0–36.0)
MCV: 84.3 fL (ref 78.0–100.0)
MPV: 9.6 fL (ref 8.6–12.4)
PLATELETS: 370 10*3/uL (ref 150–400)
RBC: 4.28 MIL/uL (ref 3.87–5.11)
RDW: 13.8 % (ref 11.5–15.5)
WBC: 7.2 10*3/uL (ref 4.0–10.5)

## 2014-07-05 LAB — BASIC METABOLIC PANEL
BUN: 11 mg/dL (ref 6–23)
CO2: 26 meq/L (ref 19–32)
Calcium: 9 mg/dL (ref 8.4–10.5)
Chloride: 105 mEq/L (ref 96–112)
Creat: 0.76 mg/dL (ref 0.50–1.10)
Glucose, Bld: 82 mg/dL (ref 70–99)
POTASSIUM: 4.3 meq/L (ref 3.5–5.3)
SODIUM: 139 meq/L (ref 135–145)

## 2014-07-05 LAB — POCT URINE PREGNANCY: Preg Test, Ur: NEGATIVE

## 2014-07-05 NOTE — Patient Instructions (Signed)
We are doing some labwork to try to figure out what's causing this, though it could just be your back pain. I will let you know what this shows.   If you have any issues please call me at [phone]  - Dr. Jarvis NewcomerGrunz

## 2014-07-05 NOTE — Progress Notes (Signed)
Subjective: Joan Moore is a 24 y.o. female presenting for abdominal pain.  Lower abdominal pain worse on left radiating around left lower back, 10/10 x2 weeks. She has been to the [facility] earlier this week for this. They did a work up which was negative. She reports urinary frequency but denies fever, dysuria, flank pain, hematuria. No vaginal discharge or irritation. Subsided with hydrocodone.    - Review of Systems: Per HPI.  - Smoking status noted Objective: BP 118/70 mmHg  Pulse 85  Temp(Src) 98.8 F (37.1 C) (Oral)  Resp 18  Wt 316 lb (143.337 kg)  SpO2 99%  LMP 06/19/2014  Breastfeeding? No Gen:  24 y.o. female in no distress GI: Normoactive BS; soft, very obese, non-tender, non-distended, no organomegaly, +suprapubic pain.  Assessment/Plan: Joan Moore is a 24 y.o. female here for lower abdominal pain.

## 2014-07-09 NOTE — Assessment & Plan Note (Signed)
U/A, UPreg, CBC, BMP. Nothing concerning on exam to guide further work up.

## 2014-07-16 ENCOUNTER — Ambulatory Visit (HOSPITAL_COMMUNITY)
Admission: RE | Admit: 2014-07-16 | Discharge: 2014-07-16 | Disposition: A | Discharge status: Home / Self Care | Payer: 59 | Source: Ambulatory Visit | Attending: Family Medicine | Admitting: Family Medicine

## 2014-07-16 ENCOUNTER — Ambulatory Visit (INDEPENDENT_AMBULATORY_CARE_PROVIDER_SITE_OTHER): Payer: 59 | Admitting: Family Medicine

## 2014-07-16 VITALS — BP 147/95 | HR 89 | Temp 98.4°F | Ht 62.0 in | Wt 320.0 lb

## 2014-07-16 DIAGNOSIS — R0789 Other chest pain: Secondary | ICD-10-CM | POA: Diagnosis present

## 2014-07-16 DIAGNOSIS — R059 Cough, unspecified: Secondary | ICD-10-CM

## 2014-07-16 DIAGNOSIS — R05 Cough: Secondary | ICD-10-CM

## 2014-07-16 MED ORDER — GUAIFENESIN-CODEINE 200-9 MG PO CAPS: 2.0000 | ORAL_CAPSULE | ORAL | Status: DC | PRN

## 2014-07-16 NOTE — Patient Instructions (Signed)
It was nice to meet you today!  Checking chest x ray to be sure you don't have pneumonia Take guaifenesin-codeine tablets as needed for cough If not getting better later this week, call for another appointment Use saline nasal spray to help as well.  Follow up within 2-3 weeks with Dr. Beryle FlockBacigalupo for the ongoing issues you've been having.  Be well, Dr. Pollie MeyerMcIntyre

## 2014-07-17 ENCOUNTER — Other Ambulatory Visit: Payer: Self-pay | Admitting: Family Medicine

## 2014-07-17 MED ORDER — GUAIFENESIN-CODEINE 100-10 MG/5ML PO SOLN: 5.0000 mL | Freq: Four times a day (QID) | ORAL | Status: DC | PRN

## 2014-07-17 NOTE — Progress Notes (Signed)
Patient ID: Joan Moore, female   DOB: [birth year], 24 y.o.   MRN: [id number]  HPI:  Pt presents for a same day appointment to discuss cough.  Patient has been coughing for about 2 weeks. She's produced phlegm with the cough. Denies hemoptysis. Has had a fever as high as 101. Has taken Advil without significant relief. She wonders if she might have an ear infection as her right ear has been clogged and aching. Think she may have some decreased hearing there. Has had some nasal congestion.  At very end of visit patient reported having chest pain for about a month. She's not sure if it is worse with ambulation, she states she does not ambulate very much when she has it. She denies having chest pain currently. She does have a history of smoking and hypertension, but no history of diabetes. She states that she has told providers about this in the past during several ER visits, but had so much else going on that this problem was not addressed.  ROS: See HPI  PMFSH: Obesity, bipolar disorder, hypertension, multiple recent ER visits  PHYSICAL EXAM: BP 147/95 mmHg  Pulse 89  Temp(Src) 98.4 F (36.9 C) (Oral)  Ht 5\' 2"  (1.575 m)  Wt 320 lb (145.151 kg)  BMI 58.51 kg/m2  LMP 07/16/2014 (Exact Date) Gen: NAD HEENT: NCAT. Oropharynx clear and moist. TMs clear bilaterally. No anterior cervical lymphadenopathy. Heart: RRR no murmurs Lungs: frequent cough. Lungs clear throughout with the exception of possible decreased aeration in RUL area. Normal respiratory effort. Chest: Anterior chest wall tender to palpation, this does reproduce the chest pain which patient reported. Abdomen: Soft nontender to palpation Neuro: Grossly nonfocal, speech normal  ASSESSMENT/PLAN:  1. Cough: Suspect viral etiology given nasal congestion, but with report of fever and cough of 2 weeks duration in setting of focal decreased aeration on exam, some concern for possible bacterial pneumonia. - Obtain chest x-ray to  evaluate for consolidated focal infiltrate. If positive for pneumonia, will call in antibiotics. - Will prescribe guaifenesin-codeine tablets to help with cough - Nasal saline spray to help with nasal congestion - Return if not improving later this week  2. Chest pain: Reported a very end of visit. Suspect musculoskeletal etiology, likely secondary to cough. Pain was reproducible with palpation of anterior chest wall. Pain does not sound anginal in nature. Patient is 24, but does have several cardiac risk factors including obesity, smoking, and hypertension. EKG performed today in clinic and was normal. No further workup required at this time.  FOLLOW UP: F/u as needed if symptoms worsen or do not improve. Instructed patient to schedule follow-up appointment with her PCP in the next several weeks to discuss multiple ongoing medical issues she's had recently which have required her to go to the emergency room.  GrenadaBrittany J. Pollie MeyerMcIntyre, MD [facility]

## 2014-07-18 ENCOUNTER — Ambulatory Visit
Admission: RE | Admit: 2014-07-18 | Discharge: 2014-07-18 | Disposition: A | Discharge status: Home / Self Care | Payer: 59 | Source: Ambulatory Visit | Attending: Family Medicine | Admitting: Family Medicine

## 2014-07-18 ENCOUNTER — Ambulatory Visit (INDEPENDENT_AMBULATORY_CARE_PROVIDER_SITE_OTHER): Payer: 59 | Admitting: Family Medicine

## 2014-07-18 ENCOUNTER — Encounter: Payer: Self-pay | Admitting: Family Medicine

## 2014-07-18 VITALS — BP 114/75 | HR 105 | Temp 99.2°F | Ht 62.0 in | Wt 315.0 lb

## 2014-07-18 DIAGNOSIS — L03116 Cellulitis of left lower limb: Secondary | ICD-10-CM

## 2014-07-18 DIAGNOSIS — L03115 Cellulitis of right lower limb: Secondary | ICD-10-CM | POA: Insufficient documentation

## 2014-07-18 DIAGNOSIS — R05 Cough: Secondary | ICD-10-CM

## 2014-07-18 DIAGNOSIS — R059 Cough, unspecified: Secondary | ICD-10-CM

## 2014-07-18 MED ORDER — CLINDAMYCIN HCL 300 MG PO CAPS: 300.0000 mg | ORAL_CAPSULE | Freq: Four times a day (QID) | ORAL | Status: DC

## 2014-07-18 NOTE — Patient Instructions (Signed)
This is a cellulitis.  Take clindamycin 4 times a day for 10 days with food. Follow up early next week to be sure this is improving. If you have high fever, chills, worsening redness, or other concerns, seek sooner follow up.  Best,  Leona SingletonMaria T Kensly Bowmer, MD  Cellulitis Cellulitis is an infection of the skin and the tissue under the skin. The infected area is usually red and tender. This happens most often in the arms and lower legs. HOME CARE   Take your antibiotic medicine as told. Finish the medicine even if you start to feel better.  Keep the infected arm or leg raised (elevated).  Put a warm cloth on the area up to 4 times per day.  Only take medicines as told by your doctor.  Keep all doctor visits as told. GET HELP IF:  You see red streaks on the skin coming from the infected area.  Your red area gets bigger or turns a dark color.  Your bone or joint under the infected area is painful after the skin heals.  Your infection comes back in the same area or different area.  You have a puffy (swollen) bump in the infected area.  You have new symptoms.  You have a fever. GET HELP RIGHT AWAY IF:   You feel very sleepy.  You throw up (vomit) or have watery poop (diarrhea).  You feel sick and have muscle aches and pains. MAKE SURE YOU:   Understand these instructions.  Will watch your condition.  Will get help right away if you are not doing well or get worse. Document Released: 11/11/2007 Document Revised: 10/09/2013 Document Reviewed: 08/10/2011 [facility] Patient Information 2015 [place] [place]. This information is not intended to replace advice given to you by your health care provider. Make sure you discuss any questions you have with your health care provider.

## 2014-07-18 NOTE — Assessment & Plan Note (Addendum)
Right upper inner thigh with cellulitis in 3x6cm area with lowgrade temperature; possibly caused by spider bite; small scab that corroborates this; no purulence or fluctuance. Not amenable to I&D. Mild nausea could be from cellulitis or could be from ?pneumonia. - Clindamycin 300mg  QID x 10 days. - Circled area of erythema and asked patient to follow this if gets larger. - can use warm compress to aid in resolution - f/u early next week or sooner if needed.

## 2014-07-18 NOTE — Progress Notes (Signed)
Patient ID: Joan Moore, female   DOB: 26-May-1991, 26 y.o.   MRN: [id number] Subjective:   CC: Knot on inner right thigh  HPI:   Patient presents to sameday clinic for knot on inner right thigh that has been present for 2-3 days.  She thinks she may have had a spider bite in this area. It has been getting bigger. She has also had mild lowgrade temperature. Area is mildly red with no pus or surrounding redness. She has had one on posterior neck that has needed to be drained in the past. She has not tried medication for it. She has had some chills, headache, back pain, and nausea. No emesis or high fevers.  She recently was seen 2 days ago and is supposed to get chest xray to evaluate for pneumonia. She has not gotten this yet.    Review of Systems - Per HPI.   PMH: Acanthosis nigricans, obesity, bipolar disorder, HTN, migraines, tobacco use    Objective:  Physical Exam BP 114/75 mmHg  Pulse 105  Temp(Src) 99.2 F (37.3 C) (Oral)  Ht 5\' 2"  (1.575 m)  Wt 315 lb (142.883 kg)  BMI 57.60 kg/m2  LMP 07/16/2014 (Exact Date) GEN: NAD, mildly fatigued-appearing SKIN: Right inner upper thigh with 3x6 cm area of erythema and 2x5cm area of induration with no fluctuance or purulence; mild tenderness; 1mm scab near lower area of induration  Assessment:     Joan Moore is a 24 y.o. female here for knot on inner right thigh    Plan:     # See problem list and after visit summary for problem-specific plans. - Urged patient to get xray chest ordered Monday and showed her where to go.  # Health Maintenance: Not discussed  Follow-up: Follow up in 5-7 days if thigh swelling not improving.  Joan SingletonMaria T Kevionna Heffler, MD [facility]

## 2014-09-25 ENCOUNTER — Ambulatory Visit: Payer: 59 | Admitting: Family Medicine

## 2014-10-01 ENCOUNTER — Ambulatory Visit: Payer: 59 | Admitting: Family Medicine

## 2014-10-02 ENCOUNTER — Ambulatory Visit: Payer: 59 | Admitting: Family Medicine

## 2014-11-01 ENCOUNTER — Encounter (HOSPITAL_COMMUNITY): Payer: Self-pay | Admitting: Emergency Medicine

## 2014-11-01 ENCOUNTER — Emergency Department (HOSPITAL_COMMUNITY)
Admission: EM | Admit: 2014-11-01 | Discharge: 2014-11-01 | Disposition: A | Discharge status: Home / Self Care | Payer: Medicaid Other | Attending: Emergency Medicine | Admitting: Emergency Medicine

## 2014-11-01 ENCOUNTER — Emergency Department (HOSPITAL_COMMUNITY): Payer: Medicaid Other

## 2014-11-01 DIAGNOSIS — S93401A Sprain of unspecified ligament of right ankle, initial encounter: Secondary | ICD-10-CM | POA: Diagnosis not present

## 2014-11-01 DIAGNOSIS — Y9389 Activity, other specified: Secondary | ICD-10-CM | POA: Diagnosis not present

## 2014-11-01 DIAGNOSIS — Z8659 Personal history of other mental and behavioral disorders: Secondary | ICD-10-CM | POA: Insufficient documentation

## 2014-11-01 DIAGNOSIS — Y998 Other external cause status: Secondary | ICD-10-CM | POA: Diagnosis not present

## 2014-11-01 DIAGNOSIS — E663 Overweight: Secondary | ICD-10-CM | POA: Insufficient documentation

## 2014-11-01 DIAGNOSIS — Y92009 Unspecified place in unspecified non-institutional (private) residence as the place of occurrence of the external cause: Secondary | ICD-10-CM | POA: Insufficient documentation

## 2014-11-01 DIAGNOSIS — Z3202 Encounter for pregnancy test, result negative: Secondary | ICD-10-CM | POA: Insufficient documentation

## 2014-11-01 DIAGNOSIS — Z792 Long term (current) use of antibiotics: Secondary | ICD-10-CM | POA: Diagnosis not present

## 2014-11-01 DIAGNOSIS — S99911A Unspecified injury of right ankle, initial encounter: Secondary | ICD-10-CM | POA: Diagnosis present

## 2014-11-01 DIAGNOSIS — Z79899 Other long term (current) drug therapy: Secondary | ICD-10-CM | POA: Insufficient documentation

## 2014-11-01 DIAGNOSIS — W108XXA Fall (on) (from) other stairs and steps, initial encounter: Secondary | ICD-10-CM | POA: Insufficient documentation

## 2014-11-01 DIAGNOSIS — Z87891 Personal history of nicotine dependence: Secondary | ICD-10-CM | POA: Insufficient documentation

## 2014-11-01 DIAGNOSIS — I10 Essential (primary) hypertension: Secondary | ICD-10-CM | POA: Diagnosis not present

## 2014-11-01 HISTORY — DX: Obesity, unspecified: E66.9

## 2014-11-01 LAB — POC URINE PREG, ED: Preg Test, Ur: NEGATIVE

## 2014-11-01 MED ORDER — OXYCODONE-ACETAMINOPHEN 5-325 MG PO TABS
1.0000 | ORAL_TABLET | Freq: Once | ORAL | Status: AC
  Administered 2014-11-01: 1 via ORAL
  Filled 2014-11-01: qty 1

## 2014-11-01 MED ORDER — OXYCODONE-ACETAMINOPHEN 5-325 MG PO TABS: 1.0000 | ORAL_TABLET | Freq: Four times a day (QID) | ORAL | Status: DC | PRN

## 2014-11-01 MED ORDER — IBUPROFEN 600 MG PO TABS: 600.0000 mg | ORAL_TABLET | Freq: Four times a day (QID) | ORAL | Status: DC | PRN

## 2014-11-01 NOTE — ED Provider Notes (Signed)
CSN: [id number]     Arrival date & time 11/01/14  0246 History  This chart was scribed for Shon Batonourtney F Carylon Tamburro, MD by Bronson CurbJacqueline Melvin, ED Scribe. This patient was seen in room B19C/B19C and the patient's care was started at 3:53 AM.   Chief Complaint  Patient presents with  . Fall    The history is provided by the patient. No language interpreter was used.     HPI Comments: Joan Moore is a 24 y.o. female who presents to the Emergency Department complaining of a fall that occurred PTA. Patient states she tripped and fell down the stairs of her home. She denies head injury or LOC. There is associated sudden onset, constant, 7/10, right lower leg pain. Patient is unable to bear weight and states she has not been ambulatory since the fall. She has not taken anything for pain relief prior to arrival. She denies any other injuries. Patient is also requesting a pregnancy test; LNMP September 15, 2014.   Past Medical History  Diagnosis Date  . Hypertension   . Bipolar 1 disorder   . Headache(784.0)   . Obesity    Past Surgical History  Procedure Laterality Date  . Cesarean section    . Cesarean section N/A 07/16/2012    Procedure: Repeat cesarean section with delivery of baby  boy at 1535. Apgars 7/9.;  Surgeon: Willodean Rosenthalarolyn Harraway-Smith, MD;  Location: [facility];  Service: Obstetrics;  Laterality: N/A;   Family History  Problem Relation Age of Onset  . Other Neg Hx   . Diabetes Mother   . Hypertension Mother   . Diabetes Maternal Grandmother   . Hypertension Maternal Grandmother   . Diabetes Maternal Grandfather   . Hypertension Maternal Grandfather    History  Substance Use Topics  . Smoking status: Former Smoker -- 0.25 packs/day    Types: Cigarettes  . Smokeless tobacco: Never Used  . Alcohol Use: No     Comment: occasional   OB History    Gravida Para Term Preterm AB TAB SAB Ectopic Multiple Living   2 2 2  0 0 0 0 0 0 2     Review of Systems  Musculoskeletal:       Right ankle  pain  Neurological: Negative for dizziness and syncope.      Allergies  Review of patient's allergies indicates no known allergies.  Home Medications   Prior to Admission medications   Medication Sig Start Date End Date Taking? Authorizing Provider  clindamycin (CLEOCIN) 300 MG capsule Take 1 capsule (300 mg total) by mouth 4 (four) times daily. For 10 days 07/18/14   Leona SingletonMaria T Thekkekandam, MD  guaiFENesin-codeine 100-10 MG/5ML syrup Take 5 mLs by mouth every 6 (six) hours as needed for cough. 07/17/14   Latrelle DodrillBrittany J McIntyre, MD  HYDROcodone-acetaminophen (NORCO/VICODIN) 5-325 MG per tablet 1 to 2 tabs every 4 to 6 hours as needed for pain. 04/06/14   Reuben Likesavid C Keller, MD  ibuprofen (ADVIL,MOTRIN) 600 MG tablet Take 1 tablet (600 mg total) by mouth every 6 (six) hours as needed. 11/01/14   Shon Batonourtney F Lillar Bianca, MD  oxyCODONE-acetaminophen (PERCOCET/ROXICET) 5-325 MG per tablet Take 1 tablet by mouth every 6 (six) hours as needed for severe pain. 11/01/14   Shon Batonourtney F Nyrie Sigal, MD   Triage Vitals: BP 162/66 mmHg  Pulse 98  Temp(Src) 98.9 F (37.2 C) (Oral)  Resp 16  SpO2 98%  LMP 09/15/2014  Physical Exam  Constitutional: She is oriented to person, place, and  time. She appears well-developed and well-nourished.  Overweight  HENT:  Head: Normocephalic and atraumatic.  Cardiovascular: Normal rate and regular rhythm.   Pulmonary/Chest: Effort normal. No respiratory distress.  Musculoskeletal:  FOcused exam of the right lower extremity:  Mild swelling of the ankle, no obvious deformity, ttp over the lateral malleolus, no prox fib tenderness, 2+ DP pulse  Neurological: She is alert and oriented to person, place, and time.  Skin: Skin is warm and dry.  Psychiatric: She has a normal mood and affect.  Nursing note and vitals reviewed.   ED Course  Procedures (including critical care time)  DIAGNOSTIC STUDIES: Oxygen Saturation is 98% on room air, normal by my interpretation.    COORDINATION  OF CARE: At 0354 Discussed treatment plan with patient which includes imaging. Patient agrees.   Labs Review Labs Reviewed  POC URINE PREG, ED    Imaging Review Dg Tibia/fibula Right  11/01/2014   CLINICAL DATA:  Initial evaluation for acute trauma, fall.  EXAM: RIGHT TIBIA AND FIBULA - 2 VIEW  COMPARISON:  None.  FINDINGS: There is no evidence of fracture or other focal bone lesions. Soft tissues are unremarkable.  IMPRESSION: Negative.   Electronically Signed   By: Rise Mu M.D.   On: 11/01/2014 04:30   Dg Ankle Complete Right  11/01/2014   CLINICAL DATA:  Fall down steps at home with lateral sided pain. Initial encounter.  EXAM: RIGHT ANKLE - COMPLETE 3+ VIEW  COMPARISON:  None.  FINDINGS: There is soft tissue swelling about the ankle, especially laterally. No fracture or malalignment.  IMPRESSION: Soft tissue swelling without fracture.   Electronically Signed   By: Marnee Spring M.D.   On: 11/01/2014 04:29     EKG Interpretation None      MDM   Final diagnoses:  Ankle sprain, right, initial encounter    S/P mechanical fall.  RIght ankle pain. Imaging neg.  Urine preg neg.  RICE therapy and ibuprofen at home. After history, exam, and medical workup I feel the patient has been appropriately medically screened and is safe for discharge home. Pertinent diagnoses were discussed with the patient. Patient was given return precautions.  I personally performed the services described in this documentation, which was scribed in my presence. The recorded information has been reviewed and is accurate.    Shon Baton, MD 11/02/14 [phone]

## 2014-11-01 NOTE — ED Notes (Signed)
Ace wrap applied by this RN to pts right ankle

## 2014-11-01 NOTE — ED Notes (Signed)
Pt. slipped and fell at home ( stairs) this evening , no LOC , reports pain at right lower leg , ambulatory . Pt. also requesting pregnancy test .

## 2014-11-01 NOTE — Discharge Instructions (Signed)

## 2014-11-06 ENCOUNTER — Ambulatory Visit: Payer: 59 | Admitting: Family Medicine

## 2015-04-15 ENCOUNTER — Encounter (HOSPITAL_COMMUNITY): Payer: Self-pay | Admitting: *Deleted

## 2015-04-15 ENCOUNTER — Emergency Department (HOSPITAL_COMMUNITY)
Admission: EM | Admit: 2015-04-15 | Discharge: 2015-04-15 | Disposition: A | Discharge status: Home / Self Care | Payer: Medicaid Other | Attending: Emergency Medicine | Admitting: Emergency Medicine

## 2015-04-15 ENCOUNTER — Emergency Department (HOSPITAL_COMMUNITY): Payer: Medicaid Other

## 2015-04-15 DIAGNOSIS — R05 Cough: Secondary | ICD-10-CM

## 2015-04-15 DIAGNOSIS — E669 Obesity, unspecified: Secondary | ICD-10-CM | POA: Insufficient documentation

## 2015-04-15 DIAGNOSIS — Z87891 Personal history of nicotine dependence: Secondary | ICD-10-CM | POA: Insufficient documentation

## 2015-04-15 DIAGNOSIS — M545 Low back pain: Secondary | ICD-10-CM | POA: Diagnosis not present

## 2015-04-15 DIAGNOSIS — Z8659 Personal history of other mental and behavioral disorders: Secondary | ICD-10-CM | POA: Insufficient documentation

## 2015-04-15 DIAGNOSIS — Z79899 Other long term (current) drug therapy: Secondary | ICD-10-CM | POA: Diagnosis not present

## 2015-04-15 DIAGNOSIS — I10 Essential (primary) hypertension: Secondary | ICD-10-CM | POA: Insufficient documentation

## 2015-04-15 DIAGNOSIS — R059 Cough, unspecified: Secondary | ICD-10-CM

## 2015-04-15 MED ORDER — BENZONATATE 100 MG PO CAPS: 100.0000 mg | ORAL_CAPSULE | Freq: Three times a day (TID) | ORAL | Status: DC

## 2015-04-15 MED ORDER — METHOCARBAMOL 500 MG PO TABS: 500.0000 mg | ORAL_TABLET | Freq: Two times a day (BID) | ORAL | Status: DC

## 2015-04-15 MED ORDER — BENZONATATE 100 MG PO CAPS
100.0000 mg | ORAL_CAPSULE | Freq: Once | ORAL | Status: AC
  Administered 2015-04-15: 100 mg via ORAL
  Filled 2015-04-15: qty 1

## 2015-04-15 MED ORDER — ALBUTEROL SULFATE HFA 108 (90 BASE) MCG/ACT IN AERS
2.0000 | INHALATION_SPRAY | RESPIRATORY_TRACT | Status: DC
  Administered 2015-04-15: 2 via RESPIRATORY_TRACT
  Filled 2015-04-15: qty 6.7

## 2015-04-15 NOTE — ED Notes (Signed)
Declined W/C at D/C and was escorted to lobby by RN. 

## 2015-04-15 NOTE — Discharge Instructions (Signed)
Cough, Adult Follow-up with a provider using the resource guide below or your own primary care physician. Return for fever or shortness of breath. A cough helps to clear your throat and lungs. A cough may last only 2-3 weeks (acute), or it may last longer than 8 weeks (chronic). Many different things can cause a cough. A cough may be a sign of an illness or another medical condition. HOME CARE  Pay attention to any changes in your cough.  Take medicines only as told by your doctor.  If you were prescribed an antibiotic medicine, take it as told by your doctor. Do not stop taking it even if you start to feel better.  Talk with your doctor before you try using a cough medicine.  Drink enough fluid to keep your pee (urine) clear or pale yellow.  If the air is dry, use a cold steam vaporizer or humidifier in your home.  Stay away from things that make you cough at work or at home.  If your cough is worse at night, try using extra pillows to raise your head up higher while you sleep.  Do not smoke, and try not to be around smoke. If you need help quitting, ask your doctor.  Do not have caffeine.  Do not drink alcohol.  Rest as needed. GET HELP IF:  You have new problems (symptoms).  You cough up yellow fluid (pus).  Your cough does not get better after 2-3 weeks, or your cough gets worse.  Medicine does not help your cough and you are not sleeping well.  You have pain that gets worse or pain that is not helped with medicine.  You have a fever.  You are losing weight and you do not know why.  You have night sweats. GET HELP RIGHT AWAY IF:  You cough up blood.  You have trouble breathing.  Your heartbeat is very fast.   This information is not intended to replace advice given to you by your health care provider. Make sure you discuss any questions you have with your health care provider.   Document Released: 02/05/2011 Document Revised: 02/13/2015 Document Reviewed:  08/01/2014 Elsevier Interactive Patient Education 2016 [employer].  Emergency Department Resource Guide 1) Find a Doctor and Pay Out of Pocket Although you won't have to find out who is covered by your insurance plan, it is a good idea to ask around and get recommendations. You will then need to call the office and see if the doctor you have chosen will accept you as a new patient and what types of options they offer for patients who are self-pay. Some doctors offer discounts or will set up payment plans for their patients who do not have insurance, but you will need to ask so you aren't surprised when you get to your appointment.  2) Contact Your Local Health Department Not all health departments have doctors that can see patients for sick visits, but many do, so it is worth a call to see if yours does. If you don't know where your local health department is, you can check in your phone book. The CDC also has a tool to help you locate your state's health department, and many state websites also have listings of all of their local health departments.  3) Find a Walk-in Clinic If your illness is not likely to be very severe or complicated, you may want to try a walk in clinic. These are popping up all over the country in  pharmacies, drugstores, and shopping centers. They're usually staffed by nurse practitioners or physician assistants that have been trained to treat common illnesses and complaints. They're usually fairly quick and inexpensive. However, if you have serious medical issues or chronic medical problems, these are probably not your best option.  No Primary Care Doctor: - Call Health Connect at  [phone] - they can help you locate a primary care doctor that  accepts your insurance, provides certain services, etc. - Physician Referral Service- [phone]  Chronic Pain Problems: Organization         Address  Phone   Notes  [facility]  [phone] Patients  need to be referred by their primary care doctor.   Medication Assistance: Organization         Address  Phone   Notes  [facility] Medication [facility] [address] [place], [place] [address] [phone] --Must be a resident of [facility] -- Must have NO insurance coverage whatsoever (no Medicaid/ Medicare, etc.) -- The pt. MUST have a primary care doctor that directs their care regularly and follows them in the community   [facility]ist  [phone]   [employer]  [phone]    Agencies that provide inexpensive medical care: Organization         Address  Phone   Notes  [facility]  [phone]   [facility]    [phone]   [facility] [address] [place], [place] [address] [phone]   [facility] [address] [place] [place] [phone]   Planned Parenthood    [phone]   [facility]    [phone]   [facility]  [address], [place] Phone:  [phone], Fax:  [phone] Hours of Operation:  9 am - 6 pm, M-F.  Also accepts Medicaid/Medicare and self-pay.  [facility]  [address], [place] Phone: [phone], Fax: [phone]. Hours of Operation:  8:30 am - 5:30 pm, M-F.  Also accepts Medicaid and self-pay.  [facility] [place] [address] [place] Phone: [phone]   [facility] [address] Natasha Bence [place], [place] [phone], Ext. [phone] Mondays & Thursdays: 7-9 AM.  First 15 patients are seen on a first come, first serve basis.    Medicaid-accepting [facility] Providers:  Organization         Address  Phone   Notes  [facility] [address], [place] [phone] Also accepts self-pay patients.  [facility] [address] Laurell Josephs [place], [place]  [phone]     [facility] [address], [place] [phone]   [facility] [address], [place] [phone]   Renaye Rakers [address], [place]   [phone] Only accepts [place] patients after they have their name applied to their card.   Self-Pay (no insurance) in [facility]:  Organization         Address  Phone   Notes  Sickle Cell Patients, [facility] [address] [place], [place] [phone]   [facility] [address] [place], [place] [phone]   [facility]  [address], [place] [phone]   [facility]/Dr. Osei-Bonsu  [address], [place] or [address], [place] [phone] Phone number for both [place] and [place] locations is the same.  [facility] [address], [place] [phone]   [facility] [address] [place] or [address] [phone]   [facility] [address], [place] [phone], phone; [phone], fax Sees patients 1st and 3rd Saturday of every month.  Must not qualify for public or private insurance (i.e. Medicaid, Medicare, [place] Health Choice, Veterans' Benefits)  Household income should be no more than 200% of the poverty level The clinic cannot treat you if you are pregnant or think you are pregnant  Sexually transmitted diseases are not treated at the clinic.    Dental Care: Organization         Address  Phone  Notes  [facility] [address] [place], [place] [phone] Accepts children up to age 72 who are enrolled in [place] or [place] Health Choice; pregnant women with a Medicaid card; and children who have applied for Medicaid or [place] Health Choice, but were declined,  whose parents can pay a reduced fee at time of service.  [facility]  [address], [place] [phone] Accepts children up to age 83 who are enrolled in [place] or [place] Health Choice; pregnant women with a Medicaid card; and children who have applied for Medicaid or [place] Health Choice, but were declined, whose parents can pay a reduced fee at time of service.  [facility]  [address] [place], [place] [phone] Patients are seen by appointment only. Walk-ins are not accepted. [facility] will see patients 70 years of age and older. Monday - Tuesday (8am-5pm) Most Wednesdays (8:30-5pm) $30 per visit, cash only  [facility]  [address], [facility] [phone] Patients are seen by appointment only. Walk-ins are not accepted. [facility] will see patients 84 years of age and older. One Wednesday Evening (Monthly: Volunteer Based).  $30 per visit, cash only  [employer]  [phone] for adults; Children under age 47, call Graduate Pediatric Dentistry at [phone]. Children aged 7-14, please call [phone] to request a pediatric application.  Dental services are provided in all areas of dental care including fillings, crowns and bridges, complete and partial dentures, implants, gum treatment, root canals, and extractions. Preventive care is also provided. Treatment is provided to both adults and children. Patients are selected via a lottery and there is often a waiting list.   [facility] [address], [place]  [phone] [email or web address]   [facility] [address] [place], [place] [phone], Ext. [phone] Second and Fourth Thursday of each month, opens at 6:30 AM; Clinic ends at 9 AM.  Patients are seen on a first-come first-served basis, and a limited number are seen during each clinic.   [facility]  [address] Ether [place], [place] [phone]   Eligibility Requirements You must have lived in [place], [place], or [place] counties for at least the last three months.   You cannot be eligible for state or federal sponsored [place], including [employer], [place], or [employer].   You generally  cannot be eligible for healthcare insurance through your employer.    How to apply: Eligibility screenings are held every Tuesday and Wednesday afternoon from 1:00 pm until 4:00 pm. You do not need an appointment for the interview!  [facility] [address], [place], [place] [phone]   [facility]  [phone]   [facility]  [phone]   [facility]  [phone]    Behavioral Health Resources in the Community: Intensive Outpatient Programs Organization         Address  Phone  Notes  [facility] [address], [place], [place] [phone]   [facility] [address], [place], [place] [phone]   [facility] [address], [place], [place]  [phone]   [facility] [address],  [place], [place] [phone] or [phone]   Substance Abuse Resources Organization         Address  Phone  Notes  [facility]  [phone]   [facility]  [phone]   [facility]  [phone]   [facility]  [phone]   Residential & Outpatient Substance Abuse Program  [phone]   Psychological Services Organization         Address  Phone  Notes  [facility]  [phone]- [phone]   [facility]  [phone]- [phone]   [facility] [address], [place] [phone] or [phone]    Mobile Crisis Teams Organization         Address  Phone  Notes  [facility], Mobile Crisis Care Unit   [phone]   [facility]  [address] [place], [place] [phone]   Doristine LocksSharon DeEsch [address] [place] [phone]    Self-Help/Support Groups Organization         Address  Phone             Notes  [facility] [place] - variety of support groups  336- [phone] Call for more information  Narcotics Anonymous (NA), [facility] [address], [place]  2 meetings at this location   Statisticianesidential Treatment Programs Organization         Address  Phone  Notes  [facility] [address],    [place]  [phone]   [facility]  [address], [place] [phone], [place], [place] [phone]   [facility] [address], [place] [phone] Admissions: 8am-3pm M-F  [facility] [address]    [place], [place] [phone]   [facility] [address], [place], [place] [phone]   [facility] [address]  [place], [place] [phone]   [facility] - Intensive Outpatient [address], [place], [place] [phone]   [facility] ([facility] [address]  [place], [place] [phone] or [phone]   [facility] ([facility]) [address] [place], [place] [phone] Accepts Medicaid  [facility] [address]  [place] [phone] Substance Abuse/Addiction Treatment   [facility] Resources Organization         Address  Phone  Notes  [facility]  [phone]) [phone]   Angie FavaJulie Brannon, PhD [address] Ske[address] [place], [place]   [phone] or [phone](336) [phone]   [facility]   [address] [place], [place] [phone]   [facility] [address] [place], [place] [phone] Insurance/Medicaid/sponsorship through [facility] and Families [address]                                     [place], [place] [phone] Therapy/tele-psych/case  [facility] [address]   [place], [place] [phone]    Dr. Lolly Mustache  [phone]   [facility]  [employer] [facility] Dept. 1) [address], [place] 2) [address], [place] 3)  [address], [place] [phone]  [phone]   [facility] Child Abuse Hotline 339-399-4841 or [phone] (After Hours)

## 2015-04-15 NOTE — ED Provider Notes (Signed)
CSN: [id number]     Arrival date & time 04/15/15  4098 History  By signing my name below, I, [name], attest that this documentation has been prepared under the direction and in the presence of Catha Gosselin, PA-C Electronically Signed: Charline Bills, ED Scribe 04/15/2015 at 2:53 PM.   Chief Complaint  Patient presents with  . Cough   The history is provided by the patient. No language interpreter was used.   HPI Comments: Joan Moore is a 24 y.o. female, with a h/o HTN, who presents to the Emergency Department complaining of persistent productive cough with yellow sputum for the past few days. Pt reports associated back pain only with coughing, sore throat, ear pain, generalized body aches, fever of 103 F a few days ago that has resolved. She has tried Nyquil and previously prescribed Guaifenesin-codeine syrup without significant relief. Pt reports diagnosis of PNA earlier this year. She quit smoking 3 weeks ago.  Past Medical History  Diagnosis Date  . Hypertension   . Bipolar 1 disorder (HCC)   . Headache(784.0)   . Obesity    Past Surgical History  Procedure Laterality Date  . Cesarean section    . Cesarean section N/A 07/16/2012    Procedure: Repeat cesarean section with delivery of baby  boy at 1535. Apgars 7/9.;  Surgeon: Willodean Rosenthal, MD;  Location: [facility];  Service: Obstetrics;  Laterality: N/A;   Family History  Problem Relation Age of Onset  . Other Neg Hx   . Diabetes Mother   . Hypertension Mother   . Diabetes Maternal Grandmother   . Hypertension Maternal Grandmother   . Diabetes Maternal Grandfather   . Hypertension Maternal Grandfather    Social History  Substance Use Topics  . Smoking status: Former Smoker -- 0.25 packs/day    Types: Cigarettes  . Smokeless tobacco: Never Used  . Alcohol Use: No     Comment: occasional   OB History    Gravida Para Term Preterm AB TAB SAB Ectopic Multiple Living   0 0 0 0 0 0 2     Review of  Systems  Constitutional: Positive for fever (resolved).  HENT: Positive for ear pain and sore throat.   Respiratory: Positive for cough.   Musculoskeletal: Positive for myalgias and back pain.   Allergies  Review of patient's allergies indicates no known allergies.  Home Medications   Prior to Admission medications   Medication Sig Start Date End Date Taking? Authorizing Provider  benzonatate (TESSALON) 100 MG capsule Take 1 capsule (100 mg total) by mouth every 8 (eight) hours. 04/15/15   Kristia Jupiter Patel-Mills, PA-C  clindamycin (CLEOCIN) 300 MG capsule Take 1 capsule (300 mg total) by mouth 4 (four) times daily. For 10 days 07/18/14   Leona Singleton, MD  guaiFENesin-codeine 100-10 MG/5ML syrup Take 5 mLs by mouth every 6 (six) hours as needed for cough. 07/17/14   Latrelle Dodrill, MD  HYDROcodone-acetaminophen (NORCO/VICODIN) 5-325 MG per tablet 1 to 2 tabs every 4 to 6 hours as needed for pain. 04/06/14   Reuben Likes, MD  ibuprofen (ADVIL,MOTRIN) 600 MG tablet Take 1 tablet (600 mg total) by mouth every 6 (six) hours as needed. 11/01/14   Shon Baton, MD  methocarbamol (ROBAXIN) 500 MG tablet Take 1 tablet (500 mg total) by mouth 2 (two) times daily. 04/15/15   Monita Swier Patel-Mills, PA-C  oxyCODONE-acetaminophen (PERCOCET/ROXICET) 5-325 MG per tablet Take 1 tablet by mouth every 6 (six)  hours as needed for severe pain. 11/01/14   Shon Baton, MD   BP 151/90 mmHg  Pulse 104  Temp(Src) 98.6 F (37 C) (Oral)  Resp 22  SpO2 98%  LMP 03/25/2015 Physical Exam  Constitutional: She is oriented to person, place, and time. She appears well-developed and well-nourished. No distress.  HENT:  Head: Normocephalic and atraumatic.  Right Ear: Tympanic membrane normal.  Left Ear: Tympanic membrane normal.  Mouth/Throat: Oropharynx is clear and moist. No oropharyngeal exudate, posterior oropharyngeal edema or posterior oropharyngeal erythema.  Oropharynx is clear without erythema,  edema or exudate. TMs normal bilaterally. No anterior cervical lymphadenopathy. No difficulty breathing or respiratory distress. No trismus or drooling.  Eyes: Conjunctivae and EOM are normal.  Neck: Neck supple. No tracheal deviation present.  Cardiovascular: Normal rate.   Pulmonary/Chest: Effort normal. No respiratory distress.  Difficulty hearing lung sounds due to morbid obesity.   Musculoskeletal: Normal range of motion.  Neurological: She is alert and oriented to person, place, and time.  Skin: Skin is warm and dry.  Psychiatric: She has a normal mood and affect. Her behavior is normal.  Nursing note and vitals reviewed.  ED Course  Procedures (including critical care time) DIAGNOSTIC STUDIES: Oxygen Saturation is 98% on RA, normal by my interpretation.    COORDINATION OF CARE: 10:15 AM-Discussed treatment plan which includes CXR and Tessalon with pt at bedside and pt agreed to plan.   Labs Review Labs Reviewed - No data to display  Imaging Review Dg Chest 2 View  04/15/2015  CLINICAL DATA:  24 year old female with cough shortness of breath and back pain since October. Initial encounter. EXAM: CHEST  2 VIEW COMPARISON:  07/18/2014 and earlier. FINDINGS: Large body habitus. Lung volumes remain normal. Cardiac size is stable at the upper limits of normal. Other mediastinal contours are within normal limits. Visualized tracheal air column is within normal limits. Lung parenchyma stable and clear. No pneumothorax or effusion. No acute osseous abnormality identified. IMPRESSION: No acute cardiopulmonary abnormality. Electronically Signed   By: Odessa Fleming M.D.   On: 04/15/2015 10:46   I have personally reviewed and evaluated these image results as part of my medical decision-making.   EKG Interpretation None      MDM   Final diagnoses:  Cough   Patient presents with cough and body aches. Her vital signs are stable and she is well-appearing. Chest x-ray is negative for pneumonia,  edema, or pneumothorax. She most likely has an upper respiratory illness that is viral. She states she stopped smoking 3 weeks ago. Rx Tessalon and Robaxin Upon discharge the patient stated that she has back pain for the last several weeks. She states that it is worse with coughing and movement. She has been seen for this in the past and states that she was given muscle relaxers which helped. She denies taking anything for the pain. She has no red flags such as bowel or bladder incontinence or retention, recent prednisone use, IV drug use, lower extremity weakness or numbness. She states that she urinated on herself yesterday because she was coughing so much. She is ambulatory with a steady gait. She has a negative straight leg raise bilaterally. NVI. No saddle anesthesia. I explained that she could take ibuprofen as needed with muscle relaxers. Medications  benzonatate (TESSALON) capsule 100 mg (100 mg Oral Given 04/15/15 1039)   Filed Vitals:   04/15/15 1124  BP: 133/94  Pulse: 96  Temp: 98 F (36.7 C)  Resp: 20  I personally performed the services described in this documentation, which was scribed in my presence. The recorded information has been reviewed and is accurate.   Catha GosselinHanna Patel-Mills, PA-C 04/15/15 1457  Laurence Spatesachel Morgan Little, MD 04/15/15 [phone]

## 2015-04-15 NOTE — ED Notes (Signed)
PT reports an on going dry cough and back pain

## 2015-07-10 ENCOUNTER — Ambulatory Visit: Payer: 59 | Admitting: Family Medicine

## 2015-09-26 ENCOUNTER — Encounter: Payer: Self-pay | Admitting: Family Medicine

## 2015-09-26 ENCOUNTER — Telehealth: Payer: Self-pay | Admitting: Family Medicine

## 2015-09-26 NOTE — Telephone Encounter (Signed)
Called patient back to discuss letter.  Patient needs assistance with gas bill.  She states that she uses gas to cook and heat home.  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   Letter at front desk.  Patient to pick up  Erasmo DownerAngela M Bacigalupo, MD, MPH PGY-2,  [facility] 09/26/2015 4:45 PM

## 2015-09-26 NOTE — Telephone Encounter (Signed)
Need to speak with provider regarding note needed for utility services.  Need to speak with today and need note by Monday.

## 2016-03-10 ENCOUNTER — Telehealth (HOSPITAL_COMMUNITY): Payer: Self-pay | Admitting: *Deleted

## 2016-03-10 ENCOUNTER — Encounter (HOSPITAL_COMMUNITY): Payer: Self-pay | Admitting: *Deleted

## 2016-03-10 NOTE — Telephone Encounter (Signed)
error 

## 2016-03-10 NOTE — Telephone Encounter (Signed)
Telephoned patient at home number and [facility]

## 2016-03-11 ENCOUNTER — Telehealth (HOSPITAL_COMMUNITY): Payer: Self-pay | Admitting: *Deleted

## 2016-03-11 NOTE — Telephone Encounter (Signed)
Patient returned call. Advised patient of negative pap smear results. HPV was negative and also gonorrhea and chlamydia. Next pap smear due in three years. Patient voiced understanding.

## 2016-03-11 NOTE — Telephone Encounter (Signed)
[facility] Patient wanted to know tests for trichomoniasis. Advised patient test was negative. Patient stated was having lower abdominal pain and was concerned. Advised patient would need to evaluated by a physician. Patient voiced understanding.

## 2016-04-22 ENCOUNTER — Ambulatory Visit (INDEPENDENT_AMBULATORY_CARE_PROVIDER_SITE_OTHER): Payer: Self-pay | Admitting: Family Medicine

## 2016-04-22 ENCOUNTER — Telehealth: Payer: Self-pay | Admitting: Family Medicine

## 2016-04-22 ENCOUNTER — Other Ambulatory Visit (HOSPITAL_COMMUNITY)
Admission: RE | Admit: 2016-04-22 | Discharge: 2016-04-22 | Disposition: A | Discharge status: Home / Self Care | Payer: Self-pay | Source: Ambulatory Visit | Attending: Family Medicine | Admitting: Family Medicine

## 2016-04-22 ENCOUNTER — Encounter: Payer: Self-pay | Admitting: Family Medicine

## 2016-04-22 VITALS — BP 145/84 | HR 79 | Temp 98.1°F | Ht 62.0 in | Wt 322.8 lb

## 2016-04-22 DIAGNOSIS — I10 Essential (primary) hypertension: Secondary | ICD-10-CM

## 2016-04-22 DIAGNOSIS — F458 Other somatoform disorders: Secondary | ICD-10-CM

## 2016-04-22 DIAGNOSIS — Z20828 Contact with and (suspected) exposure to other viral communicable diseases: Secondary | ICD-10-CM

## 2016-04-22 DIAGNOSIS — Z202 Contact with and (suspected) exposure to infections with a predominantly sexual mode of transmission: Secondary | ICD-10-CM

## 2016-04-22 DIAGNOSIS — Z113 Encounter for screening for infections with a predominantly sexual mode of transmission: Secondary | ICD-10-CM | POA: Insufficient documentation

## 2016-04-22 DIAGNOSIS — R0989 Other specified symptoms and signs involving the circulatory and respiratory systems: Secondary | ICD-10-CM

## 2016-04-22 DIAGNOSIS — E049 Nontoxic goiter, unspecified: Secondary | ICD-10-CM | POA: Insufficient documentation

## 2016-04-22 LAB — TSH: TSH: 0.76 mIU/L

## 2016-04-22 LAB — T4, FREE: FREE T4: 1.1 ng/dL (ref 0.8–1.8)

## 2016-04-22 NOTE — Assessment & Plan Note (Signed)
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

## 2016-04-22 NOTE — Telephone Encounter (Signed)
Patient was referred to a ENT but [facility], all of patient's Insurance are inactive. I have spoken to patient and she states that she will call me back with insurance info. Please send info back to me. Thanks.

## 2016-04-22 NOTE — Assessment & Plan Note (Signed)
Goiter appreciated on exam Could be related to patient's globus sensation Check TSH and free T4 Thyroid ultrasound scheduled Consider referral to endocrinology pending results

## 2016-04-22 NOTE — Patient Instructions (Signed)
Nice to see you again today. We will get some labs to check your thyroid as well as an ultrasound. We are also checking for STDs. Someone will call you with these results when they're available.  I'm also putting in a referral to ENT so they can look in your throat.  You need an appointment for blood pressure follow-up within the next month  Take care, Dr. BLeonard Schwartz

## 2016-04-22 NOTE — Progress Notes (Signed)
   Subjective:   Joan Moore is a 25 y.o. female with a history of Bipolar disorder, migraines, hypertension, obesity here for same day appointment for "something stuck in my throat"  Patient reports throat irritation 3 months. She remembers something getting stuck about 3 months ago when she was having oral intercourse. She reports that her partner does not have any piercings or anything that could've become dislodged. She induced vomiting when this occurred, but nothing came up. Since that time, she is felt throat irritation as if something is in there.  She felt it again last week when having oral intercourse.  Swelling is not painful, she has good oral intake, denies any fevers, cough, vomiting, shortness breath, chest pain, abdominal pain.  Review of Systems:  Per HPI.   Social History: former smoker  Objective:  BP (!) 145/84   Pulse 79   Temp 98.1 F (36.7 C) (Oral)   Ht 5\' 2"  (1.575 m)   Wt (!) 322 lb 12.8 oz (146.4 kg)   BMI 59.04 kg/m   Gen:  25 y.o. female in NAD  HEENT: NCAT, MMM, EOMI, PERRL, anicteric sclerae, OP clear, enlarged tonsils Neck: no LAD, +goiter CV: RRR, no MRG Resp: Non-labored, CTAB, no wheezes noted Abd: Soft, NTND, BS present, no guarding or organomegaly Ext: WWP, no edema Neuro: Alert and oriented, speech normal      Assessment & Plan:     Joan Moore is a 25 y.o. female here for   Goiter Goiter appreciated on exam Could be related to patient's globus sensation Check TSH and free T4 Thyroid ultrasound scheduled Consider referral to endocrinology pending results  Globus sensation No foreign body appreciated on exam, the patient insists that there must be something in her throat Could be related to her goiter - treat as above Check oral pharyngeal GC chlamydia Other STD testing with HIV and RPR as well Referral to ENT for possible laryngoscopy   Erasmo DownerAngela M Bacigalupo, MD MPH PGY-3,  [facility] 04/22/2016  9:55 AM

## 2016-04-23 LAB — HIV ANTIBODY (ROUTINE TESTING W REFLEX): HIV 1&2 Ab, 4th Generation: NONREACTIVE

## 2016-04-23 LAB — GC/CHLAMYDIA PROBE AMP (~~LOC~~) NOT AT ARMC
CHLAMYDIA, DNA PROBE: NEGATIVE
Neisseria Gonorrhea: NEGATIVE

## 2016-04-23 LAB — RPR

## 2016-04-24 NOTE — Telephone Encounter (Signed)
-----   Message from Erasmo DownerAngela M Bacigalupo, MD sent at 04/23/2016  4:48 PM EST ----- Please let patient know that STD testing including HIV, RPR, GC chlamydia all negative. Thyroid testing also within normal limits. Thank you  Erasmo DownerAngela M Bacigalupo, MD, MPH PGY-3,  [facility] 04/23/2016 4:48 PM

## 2016-04-24 NOTE — Telephone Encounter (Signed)
-----   Message from Angela M Bacigalupo, MD sent at 04/23/2016  4:48 PM EST ----- Please let patient know that STD testing including HIV, RPR, GC chlamydia all negative. Thyroid testing also within normal limits. Thank you  Angela M Bacigalupo, MD, MPH PGY-3,  [facility] 04/23/2016 4:48 PM  

## 2016-04-24 NOTE — Telephone Encounter (Signed)
Patient informed of lab results, expressed understanding.

## 2016-04-24 NOTE — Telephone Encounter (Signed)
Called pt, phone rang a few times and just stopped. No vm set up. Will try calling again. If pt calls in please relate results to her. Thanks. Maryjean Mornempestt S Roberts, CMA

## 2016-04-29 ENCOUNTER — Ambulatory Visit (HOSPITAL_COMMUNITY): Payer: Self-pay

## 2016-05-11 ENCOUNTER — Ambulatory Visit (HOSPITAL_COMMUNITY)
Admission: RE | Admit: 2016-05-11 | Discharge: 2016-05-11 | Disposition: A | Discharge status: Home / Self Care | Payer: Self-pay | Source: Ambulatory Visit | Attending: Family Medicine | Admitting: Family Medicine

## 2016-05-11 DIAGNOSIS — R0989 Other specified symptoms and signs involving the circulatory and respiratory systems: Secondary | ICD-10-CM

## 2016-05-11 DIAGNOSIS — E049 Nontoxic goiter, unspecified: Secondary | ICD-10-CM

## 2016-05-12 ENCOUNTER — Encounter: Payer: Self-pay | Admitting: Family Medicine

## 2017-06-22 ENCOUNTER — Emergency Department (HOSPITAL_COMMUNITY): Payer: Self-pay

## 2017-06-22 ENCOUNTER — Emergency Department (HOSPITAL_COMMUNITY)
Admission: EM | Admit: 2017-06-22 | Discharge: 2017-06-22 | Disposition: A | Discharge status: Home / Self Care | Payer: Self-pay | Attending: Emergency Medicine | Admitting: Emergency Medicine

## 2017-06-22 ENCOUNTER — Encounter (HOSPITAL_COMMUNITY): Payer: Self-pay

## 2017-06-22 ENCOUNTER — Other Ambulatory Visit: Payer: Self-pay

## 2017-06-22 DIAGNOSIS — Z79899 Other long term (current) drug therapy: Secondary | ICD-10-CM | POA: Insufficient documentation

## 2017-06-22 DIAGNOSIS — F319 Bipolar disorder, unspecified: Secondary | ICD-10-CM | POA: Insufficient documentation

## 2017-06-22 DIAGNOSIS — Z87891 Personal history of nicotine dependence: Secondary | ICD-10-CM | POA: Insufficient documentation

## 2017-06-22 DIAGNOSIS — J069 Acute upper respiratory infection, unspecified: Secondary | ICD-10-CM | POA: Insufficient documentation

## 2017-06-22 DIAGNOSIS — I10 Essential (primary) hypertension: Secondary | ICD-10-CM | POA: Insufficient documentation

## 2017-06-22 LAB — RAPID STREP SCREEN (MED CTR MEBANE ONLY): Streptococcus, Group A Screen (Direct): NEGATIVE

## 2017-06-22 MED ORDER — IBUPROFEN 600 MG PO TABS: 600.0000 mg | ORAL_TABLET | Freq: Four times a day (QID) | ORAL | 0 refills | Status: DC | PRN

## 2017-06-22 MED ORDER — CETIRIZINE-PSEUDOEPHEDRINE ER 5-120 MG PO TB12: 1.0000 | ORAL_TABLET | Freq: Every day | ORAL | 0 refills | Status: DC

## 2017-06-22 MED ORDER — BENZONATATE 100 MG PO CAPS: 100.0000 mg | ORAL_CAPSULE | Freq: Three times a day (TID) | ORAL | 0 refills | Status: DC

## 2017-06-22 NOTE — ED Triage Notes (Signed)
Pt reports sore throat X3 day with fever. Pt reports fever at home of 102, states that she has been using tylenol for fever.

## 2017-06-22 NOTE — Discharge Instructions (Signed)
Take Tessalon every 8 hours as needed for cough.  Take Zyrtec-D twice daily for nasal congestion.  Take ibuprofen every 6 hours as needed for sore throat, body aches, or fever.  You can also alternate with Tylenol as prescribed over-the-counter.  Please follow-up with your doctor for further evaluation of the irritation in your throat.  You can also follow-up with the ear nose and throat doctor, Dr. Annalee GentaShoemaker.

## 2017-06-22 NOTE — ED Provider Notes (Signed)
[facility] EMERGENCY DEPARTMENT Provider Note   CSN: [id number] Arrival date & time: 06/22/17  1021     History   Chief Complaint Chief Complaint  Patient presents with  . Sore Throat    HPI Joan Moore is a 27 y.o. female with history of bipolar 1 disorder, hypertension, obesity who presents with a 5-day history of cough, nasal congestion, sore throat, and fevers.  Her fever has been up to 102.  She has been taking Tylenol at home for fever, but no other medications.  She also reports that she has had intermittent sore throat for the past 2 years after she swallowed a foreign body during oral sex.  She has not been evaluated for this in the past.  She does not report any significant worsening of the symptoms.  She has been able to tolerate eating and drinking.  She denies any chest pain or shortness of breath.  She has had some shortness of breath at night with coughing and laying down.  She did have a few episodes of post tussive emesis.  However, she denies any abdominal pain or diarrhea.  Patient has had decreased hearing in her left ear since onset  HPI  Past Medical History:  Diagnosis Date  . Bipolar 1 disorder (HCC)   . Headache(784.0)   . Hypertension   . Obesity     Patient Active Problem List   Diagnosis Date Noted  . Globus sensation 04/22/2016  . Goiter 04/22/2016  . Abdominal pain 07/05/2014  . Emergency contraception 03/26/2014  . Hypertension 11/19/2010  . MIGRAINE HEADACHE 06/16/2010  . OBESITY 10/18/2007  . BIPOLAR DEPRESSION 10/18/2007    Past Surgical History:  Procedure Laterality Date  . CESAREAN SECTION    . CESAREAN SECTION N/A 07/16/2012   Procedure: Repeat cesarean section with delivery of baby  boy at 1535. Apgars 7/9.;  Surgeon: Willodean Rosenthal, MD;  Location: [facility];  Service: Obstetrics;  Laterality: N/A;    OB History    Gravida Para Term Preterm AB Living   2 2 2  0 0 2   SAB TAB Ectopic Multiple Live Births     0 0 0 0 2       Home Medications    Prior to Admission medications   Medication Sig Start Date End Date Taking? Authorizing Provider  benzonatate (TESSALON) 100 MG capsule Take 1 capsule (100 mg total) by mouth every 8 (eight) hours. 06/22/17   Cleveland Yarbro, Waylan Boga, PA-C  cetirizine-pseudoephedrine (ZYRTEC-D) 5-120 MG tablet Take 1 tablet by mouth daily. 06/22/17   Pride Gonzales, Waylan Boga, PA-C  clindamycin (CLEOCIN) 300 MG capsule Take 1 capsule (300 mg total) by mouth 4 (four) times daily. For 10 days 07/18/14   Leona Singleton, MD  guaiFENesin-codeine 100-10 MG/5ML syrup Take 5 mLs by mouth every 6 (six) hours as needed for cough. 07/17/14   Latrelle Dodrill, MD  HYDROcodone-acetaminophen (NORCO/VICODIN) 5-325 MG per tablet 1 to 2 tabs every 4 to 6 hours as needed for pain. 04/06/14   Reuben Likes, MD  ibuprofen (ADVIL,MOTRIN) 600 MG tablet Take 1 tablet (600 mg total) by mouth every 6 (six) hours as needed. 06/22/17   Puneet Selden, Waylan Boga, PA-C  methocarbamol (ROBAXIN) 500 MG tablet Take 1 tablet (500 mg total) by mouth 2 (two) times daily. 04/15/15   Patel-Mills, Lorelle Formosa, PA-C  oxyCODONE-acetaminophen (PERCOCET/ROXICET) 5-325 MG per tablet Take 1 tablet by mouth every 6 (six) hours as needed for severe pain. 11/01/14  Horton, Mayer Masker, MD    Family History Family History  Problem Relation Age of Onset  . Diabetes Mother   . Hypertension Mother   . Diabetes Maternal Grandmother   . Hypertension Maternal Grandmother   . Diabetes Maternal Grandfather   . Hypertension Maternal Grandfather   . Other Neg Hx     Social History Social History   Tobacco Use  . Smoking status: Former Smoker    Packs/day: 0.25    Types: Cigarettes  . Smokeless tobacco: Never Used  Substance Use Topics  . Alcohol use: No    Comment: occasional  . Drug use: No     Allergies   Patient has no known allergies.   Review of Systems Review of Systems  Constitutional: Positive for chills and fever.   HENT: Positive for congestion and sore throat. Negative for facial swelling.   Respiratory: Positive for cough and shortness of breath.   Cardiovascular: Negative for chest pain.  Gastrointestinal: Positive for vomiting (post-tussive). Negative for abdominal pain and nausea.  Genitourinary: Negative for dysuria.  Musculoskeletal: Negative for back pain.  Skin: Negative for rash and wound.  Neurological: Negative for headaches.  Psychiatric/Behavioral: The patient is not nervous/anxious.      Physical Exam Updated Vital Signs BP 136/71 (BP Location: Right Arm)   Pulse 79   Temp 98.2 F (36.8 C) (Oral)   Resp 16   Ht 5\' 1"  (1.549 m)   Wt (!) 147.4 kg (325 lb)   LMP 06/21/2017 (Exact Date)   SpO2 98%   BMI 61.41 kg/m   Physical Exam  Constitutional: She appears well-developed and well-nourished. No distress.  HENT:  Head: Normocephalic and atraumatic.  Right Ear: Tympanic membrane normal.  Mouth/Throat: Oropharynx is clear and moist. No oropharyngeal exudate.  Cerumen impaction L ear  Eyes: Conjunctivae are normal. Pupils are equal, round, and reactive to light. Right eye exhibits no discharge. Left eye exhibits no discharge. No scleral icterus.  Neck: Normal range of motion. Neck supple. No thyromegaly present.  Cardiovascular: Normal rate, regular rhythm, normal heart sounds and intact distal pulses. Exam reveals no gallop and no friction rub.  No murmur heard. Pulmonary/Chest: Effort normal. No stridor. No respiratory distress. She has decreased breath sounds (could be due to body habitus). She has no wheezes. She has no rales.  Abdominal: Soft. Bowel sounds are normal. She exhibits no distension. There is no tenderness. There is no rebound and no guarding.  Musculoskeletal: She exhibits no edema.  Lymphadenopathy:    She has no cervical adenopathy.  Neurological: She is alert. Coordination normal.  Skin: Skin is warm and dry. No rash noted. She is not diaphoretic. No  pallor.  Psychiatric: She has a normal mood and affect.  Nursing note and vitals reviewed.    ED Treatments / Results  Labs (all labs ordered are listed, but only abnormal results are displayed) Labs Reviewed  RAPID STREP SCREEN (NOT AT [facility])  CULTURE, GROUP A STREP [facility])    EKG  EKG Interpretation None       Radiology Dg Neck Soft Tissue  Result Date: 06/22/2017 CLINICAL DATA:  Swallowed foreign body 2 years ago but still feels foreign body sensation EXAM: NECK SOFT TISSUES - 1+ VIEW COMPARISON:  None. FINDINGS: There is no evidence of retropharyngeal soft tissue swelling or epiglottic enlargement. The cervical airway is unremarkable and no radio-opaque foreign body identified. IMPRESSION: Negative. Electronically Signed   By: Jasmine Pang M.D.   On: 06/22/2017 14:42  Dg Chest 2 View  Result Date: 06/22/2017 CLINICAL DATA:  Fever and cough for 5 days EXAM: CHEST  2 VIEW COMPARISON:  04/15/2015 FINDINGS: The heart size and mediastinal contours are within normal limits. Both lungs are clear. The visualized skeletal structures are unremarkable. IMPRESSION: No active cardiopulmonary disease. Electronically Signed   By: Gaylyn RongWalter  Liebkemann M.D.   On: 06/22/2017 13:19    Procedures Procedures (including critical care time)  Medications Ordered in ED Medications - No data to display   Initial Impression / Assessment and Plan / ED Course  I have reviewed the triage vital signs and the nursing notes.  Pertinent labs & imaging results that were available during my care of the patient were reviewed by me and considered in my medical decision making (see chart for details).     Patient with probable viral upper respiratory infection.  Rapid strep is negative.  Chest x-ray is negative.  Will treat supportively.  Patient also concerned about a persistent irritation in her throat after possible swallowing of a foreign body 2 years ago.  X-ray of the neck soft tissue is negative.   Will refer to PCP for further evaluation.  Return precautions discussed.  Patient understands and agrees with plan.  Patient vitals stable and discharged in satisfactory condition.  Final Clinical Impressions(s) / ED Diagnoses   Final diagnoses:  Upper respiratory tract infection, unspecified type    ED Discharge Orders        Ordered    benzonatate (TESSALON) 100 MG capsule  Every 8 hours     06/22/17 1511    cetirizine-pseudoephedrine (ZYRTEC-D) 5-120 MG tablet  Daily     06/22/17 1511    ibuprofen (ADVIL,MOTRIN) 600 MG tablet  Every 6 hours PRN     06/22/17 1511       Jishnu Jenniges, Waylan Bogalexandra M, PA-C 06/22/17 1725    Mancel BaleWentz, Elliott, MD 06/23/17 [phone]

## 2017-06-24 LAB — CULTURE, GROUP A STREP (THRC)

## 2019-08-26 ENCOUNTER — Encounter (HOSPITAL_COMMUNITY): Payer: Self-pay | Admitting: Family Medicine

## 2019-08-26 ENCOUNTER — Other Ambulatory Visit: Payer: Self-pay

## 2019-08-26 ENCOUNTER — Ambulatory Visit (HOSPITAL_COMMUNITY)
Admission: EM | Admit: 2019-08-26 | Discharge: 2019-08-26 | Disposition: A | Discharge status: Home / Self Care | Payer: Self-pay | Attending: Family Medicine | Admitting: Family Medicine

## 2019-08-26 DIAGNOSIS — K0510 Chronic gingivitis, plaque induced: Secondary | ICD-10-CM

## 2019-08-26 DIAGNOSIS — R0789 Other chest pain: Secondary | ICD-10-CM

## 2019-08-26 MED ORDER — DICLOFENAC SODIUM 75 MG PO TBEC: 75.0000 mg | DELAYED_RELEASE_TABLET | Freq: Two times a day (BID) | ORAL | 0 refills | Status: AC

## 2019-08-26 MED ORDER — CHLORHEXIDINE GLUCONATE 0.12 % MT SOLN: 15.0000 mL | Freq: Two times a day (BID) | OROMUCOSAL | 0 refills | Status: AC

## 2019-08-26 NOTE — ED Provider Notes (Addendum)
[facility]    CSN: [id number] Arrival date & time: 08/26/19  1046      History   Chief Complaint No chief complaint on file.   HPI Joan Moore is a 29 y.o. female.   First visit to [facility] in over 6 years.  Today, complaining of left chest pain, left arm numbness and shortness of breath since last night, patient has a PMHx of hypertension, goiter, obesity and bipolar illness.  She is 29 years old and G2P2.   Patient denies fever, leg pain. Past similar hx neg.   Recent gingivitis in process of workup.  Recent headaches.  Patient no longer takes anything high blood pressure.  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  The pain began while driving to work yesterday.     Past Medical History:  Diagnosis Date  . Bipolar 1 disorder (HCC)   . Headache(784.0)   . Hypertension   . Obesity     Patient Active Problem List   Diagnosis Date Noted  . Globus sensation 04/22/2016  . Goiter 04/22/2016  . Abdominal pain 07/05/2014  . Emergency contraception 03/26/2014  . Hypertension 11/19/2010  . MIGRAINE HEADACHE 06/16/2010  . OBESITY 10/18/2007  . BIPOLAR DEPRESSION 10/18/2007    Past Surgical History:  Procedure Laterality Date  . CESAREAN SECTION    . CESAREAN SECTION N/A 07/16/2012   Procedure: Repeat cesarean section with delivery of baby  boy at 1535. Apgars 7/9.;  Surgeon: Willodean Rosenthal, MD;  Location: [facility];  Service: Obstetrics;  Laterality: N/A;    OB History    Gravida  2   Para  2   Term  2   Preterm  0   AB  0   Living  2     SAB  0   TAB  0   Ectopic  0   Multiple  0   Live Births  2            Home Medications    Prior to Admission medications   Medication Sig Start Date End Date Taking? Authorizing Provider  benzonatate (TESSALON) 100 MG capsule Take 1 capsule (100 mg total) by mouth every 8 (eight) hours. 06/22/17   Law, Waylan Boga, PA-C  cetirizine-pseudoephedrine (ZYRTEC-D) 5-120 MG  tablet Take 1 tablet by mouth daily. 06/22/17   Law, Waylan Boga, PA-C  clindamycin (CLEOCIN) 300 MG capsule Take 1 capsule (300 mg total) by mouth 4 (four) times daily. For 10 days 07/18/14   Leona Singleton, MD  guaiFENesin-codeine 100-10 MG/5ML syrup Take 5 mLs by mouth every 6 (six) hours as needed for cough. 07/17/14   Latrelle Dodrill, MD  HYDROcodone-acetaminophen (NORCO/VICODIN) 5-325 MG per tablet 1 to 2 tabs every 4 to 6 hours as needed for pain. 04/06/14   Reuben Likes, MD  ibuprofen (ADVIL,MOTRIN) 600 MG tablet Take 1 tablet (600 mg total) by mouth every 6 (six) hours as needed. 06/22/17   Law, Waylan Boga, PA-C  methocarbamol (ROBAXIN) 500 MG tablet Take 1 tablet (500 mg total) by mouth 2 (two) times daily. 04/15/15   Patel-Mills, Lorelle Formosa, PA-C  oxyCODONE-acetaminophen (PERCOCET/ROXICET) 5-325 MG per tablet Take 1 tablet by mouth every 6 (six) hours as needed for severe pain. 11/01/14   Horton, Mayer Masker, MD    Family History Family History  Problem Relation Age of Onset  . Diabetes Mother   . Hypertension Mother   . Diabetes Maternal Grandmother   .  Hypertension Maternal Grandmother   . Diabetes Maternal Grandfather   . Hypertension Maternal Grandfather   . Other Neg Hx     Social History Social History   Tobacco Use  . Smoking status: Former Smoker    Packs/day: 0.25    Types: Cigarettes  . Smokeless tobacco: Never Used  Substance Use Topics  . Alcohol use: No    Comment: occasional  . Drug use: No     Allergies   Patient has no known allergies.   Review of Systems Review of Systems  Constitutional: Negative.   HENT: Positive for dental problem.   Respiratory: Positive for shortness of breath.   Cardiovascular: Positive for chest pain.  Gastrointestinal: Positive for nausea. Negative for abdominal distention, abdominal pain, constipation, diarrhea and vomiting.  Musculoskeletal: Negative for neck pain.  Neurological: Positive for numbness and  headaches. Negative for weakness.  All other systems reviewed and are negative.    Physical Exam Triage Vital Signs ED Triage Vitals  Enc Vitals Group     BP      Pulse      Resp      Temp      Temp src      SpO2      Weight      Height      Head Circumference      Peak Flow      Pain Score      Pain Loc      Pain Edu?      Excl. in GC?    No data found.  Updated Vital Signs BP (!) 169/87 Comment: non-compliant with meds x "years"  Pulse (!) 104   Temp 98.7 F (37.1 C) (Oral)   Resp 18   SpO2 100%    Physical Exam Vitals and nursing note reviewed.  Constitutional:      Appearance: She is well-developed. She is obese.     Comments: Diffuse gingivitis.  HENT:     Head: Normocephalic.  Eyes:     Pupils: Pupils are equal, round, and reactive to light.  Cardiovascular:     Rate and Rhythm: Normal rate.     Heart sounds: Normal heart sounds. No murmur. No systolic murmur. No friction rub. No S4 sounds.   Pulmonary:     Effort: Pulmonary effort is normal.     Breath sounds: Normal breath sounds.  Chest:     Chest wall: Tenderness present. No deformity or crepitus.  Musculoskeletal:        General: Normal range of motion.     Cervical back: Normal range of motion and neck supple.     Comments: Tender left upper anterior chest.  Lymphadenopathy:     Cervical: No cervical adenopathy.  Skin:    General: Skin is warm and dry.  Neurological:     General: No focal deficit present.     Mental Status: She is alert and oriented to person, place, and time.  Psychiatric:        Mood and Affect: Mood normal.        Behavior: Behavior normal.      UC Treatments / Results  Labs (all labs ordered are listed, but only abnormal results are displayed) Labs Reviewed - No data to display  EKG 12 lead EKG shows slight tachycardia without ST-T wave changes.  Radiology No results found.  Procedures Procedures (including critical care time)  Medications Ordered in  UC Medications - No data to display  Initial  Impression / Assessment and Plan / UC Course  I have reviewed the triage vital signs and the nursing notes.  Pertinent labs & imaging results that were available during my care of the patient were reviewed by me and considered in my medical decision making (see chart for details).    Final Clinical Impressions(s) / UC Diagnoses   Final diagnoses:  None   Discharge Instructions   None    ED Prescriptions    None     I have reviewed the PDMP during this encounter.   Robyn Haber, MD 08/26/19 1123    Robyn Haber, MD 08/26/19 [phone]

## 2019-08-26 NOTE — Discharge Instructions (Addendum)
Please return if pain worsens or fails to improve in 48 hours.

## 2019-08-26 NOTE — ED Triage Notes (Signed)
Pt reports starting with intermittent left chest pain "like a heat" last night, which has become constant this AM.  Pain radiates up into left neck with intermittent numbness in LUE  "and starting some in my right arm".  C/O some nausea and feels some SOB.  Pain is worse when bending over or taking deep breaths.  Denies cough or fevers. EKG shown to Dr Milus Glazier.

## 2020-02-07 ENCOUNTER — Encounter (HOSPITAL_COMMUNITY): Payer: Self-pay

## 2020-02-07 ENCOUNTER — Ambulatory Visit (HOSPITAL_COMMUNITY)
Admission: EM | Admit: 2020-02-07 | Discharge: 2020-02-07 | Disposition: A | Discharge status: Home / Self Care | Payer: Self-pay | Attending: Emergency Medicine | Admitting: Emergency Medicine

## 2020-02-07 ENCOUNTER — Other Ambulatory Visit: Payer: Self-pay

## 2020-02-07 DIAGNOSIS — L02213 Cutaneous abscess of chest wall: Secondary | ICD-10-CM

## 2020-02-07 MED ORDER — DOXYCYCLINE HYCLATE 100 MG PO CAPS: 100.0000 mg | ORAL_CAPSULE | Freq: Two times a day (BID) | ORAL | 0 refills | Status: DC

## 2020-02-07 NOTE — ED Provider Notes (Signed)
[facility]    CSN: [id number] Arrival date & time: 02/07/20  0847      History   Chief Complaint Chief Complaint  Patient presents with  . Breast Mass    HPI Joan Moore is a 29 y.o. female.   Joan Moore presents with complaints of a mass to right chest. It decreased in size and has since increased again. Noted it 3 weeks ago. Pain with touch or movement- burning sensation. 8/21- tested positive for covid (quarantine since 8/17). She feels like it increased in size while she had covid. She thinks her great grandmother had breast cancer, uncertain however. Not breastfeeding. No discharge to nipple. She has sensitivity to her nipple. History of same to the left breast which was a cyst. Hasn't had a recent mammogram. She does have a PCP.     ROS per HPI, negative if not otherwise mentioned.      Past Medical History:  Diagnosis Date  . Bipolar 1 disorder (HCC)   . Headache(784.0)   . Hypertension   . Obesity     Patient Active Problem List   Diagnosis Date Noted  . Globus sensation 04/22/2016  . Goiter 04/22/2016  . Abdominal pain 07/05/2014  . Emergency contraception 03/26/2014  . Hypertension 11/19/2010  . MIGRAINE HEADACHE 06/16/2010  . OBESITY 10/18/2007  . BIPOLAR DEPRESSION 10/18/2007    Past Surgical History:  Procedure Laterality Date  . CESAREAN SECTION    . CESAREAN SECTION N/A 07/16/2012   Procedure: Repeat cesarean section with delivery of baby  boy at 1535. Apgars 7/9.;  Surgeon: Willodean Rosenthal, MD;  Location: [facility];  Service: Obstetrics;  Laterality: N/A;    OB History    Gravida  2   Para  2   Term  2   Preterm  0   AB  0   Living  2     SAB  0   TAB  0   Ectopic  0   Multiple  0   Live Births  2            Home Medications    Prior to Admission medications   Medication Sig Start Date End Date Taking? Authorizing Provider  chlorhexidine (PERIDEX) 0.12 % solution Use as directed 15 mLs in the  mouth or throat 2 (two) times daily. 08/26/19   Elvina Sidle, MD  diclofenac (VOLTAREN) 75 MG EC tablet Take 1 tablet (75 mg total) by mouth 2 (two) times daily. 08/26/19   Elvina Sidle, MD  doxycycline (VIBRAMYCIN) 100 MG capsule Take 1 capsule (100 mg total) by mouth 2 (two) times daily. 02/07/20   Georgetta Haber, NP    Family History Family History  Problem Relation Age of Onset  . Diabetes Mother   . Hypertension Mother   . Diabetes Maternal Grandmother   . Hypertension Maternal Grandmother   . Diabetes Maternal Grandfather   . Hypertension Maternal Grandfather   . Other Neg Hx     Social History Social History   Tobacco Use  . Smoking status: Former Smoker    Packs/day: 0.25    Types: Cigarettes  . Smokeless tobacco: Never Used  Vaping Use  . Vaping Use: Never used  Substance Use Topics  . Alcohol use: No    Comment: occasional  . Drug use: No     Allergies   Patient has no known allergies.   Review of Systems Review of Systems   Physical Exam Triage Vital  Signs ED Triage Vitals  Enc Vitals Group     BP 02/07/20 1008 130/78     Pulse Rate 02/07/20 1008 96     Resp 02/07/20 1008 17     Temp 02/07/20 1008 99.1 F (37.3 C)     Temp Source 02/07/20 1008 Oral     SpO2 02/07/20 1008 95 %     Weight --      Height --      Head Circumference --      Peak Flow --      Pain Score 02/07/20 1005 5     Pain Loc --      Pain Edu? --      Excl. in GC? --    No data found.  Updated Vital Signs BP 130/78 (BP Location: Left Arm)   Pulse 96   Temp 99.1 F (37.3 C) (Oral)   Resp 17   LMP 02/01/2020   SpO2 95%   Visual Acuity Right Eye Distance:   Left Eye Distance:   Bilateral Distance:    Right Eye Near:   Left Eye Near:    Bilateral Near:     Physical Exam Constitutional:      General: She is not in acute distress.    Appearance: She is well-developed.  Cardiovascular:     Rate and Rhythm: Normal rate.  Pulmonary:     Effort: Pulmonary  effort is normal.  Chest:       Comments: Approximately 1.5 cm fluctuant abscess which is tender, no drainage, mildly red to surrounding tissue  Skin:    General: Skin is warm and dry.  Neurological:     Mental Status: She is alert and oriented to person, place, and time.      UC Treatments / Results  Labs (all labs ordered are listed, but only abnormal results are displayed) Labs Reviewed - No data to display  EKG   Radiology No results found.  Procedures Incision and Drainage  Date/Time: 02/07/2020 10:51 AM Performed by: Georgetta Haber, NP Authorized by: Georgetta Haber, NP   Consent:    Consent obtained:  Verbal   Consent given by:  Patient   Risks discussed:  Bleeding, incomplete drainage and pain   Alternatives discussed:  No treatment, alternative treatment, observation and referral Location:    Type:  Abscess   Size:  1.5   Location:  Trunk   Trunk location:  R breast Pre-procedure details:    Skin preparation:  Betadine Anesthesia (see MAR for exact dosages):    Anesthesia method:  Topical application   Topical anesthesia: pain ease freeze spray  Procedure type:    Complexity:  Simple Procedure details:    Incision types:  Stab incision   Scalpel blade:  11   Drainage:  Purulent   Drainage amount:  Moderate   Wound treatment:  Wound left open   Packing materials:  None Post-procedure details:    Patient tolerance of procedure:  Tolerated well, no immediate complications   (including critical care time)  Medications Ordered in UC Medications - No data to display  Initial Impression / Assessment and Plan / UC Course  I have reviewed the triage vital signs and the nursing notes.  Pertinent labs & imaging results that were available during my care of the patient were reviewed by me and considered in my medical decision making (see chart for details).     Right medial breast abscess drained, patient tolerated well. Antibiotics provided.  Encouraged follow up with PCP for recheck. Return precautions provided. Patient verbalized understanding and agreeable to plan.   Final Clinical Impressions(s) / UC Diagnoses   Final diagnoses:  Chest wall abscess     Discharge Instructions     Apply heat to promote additional drainage.  Keep dressing in place for the next 24 hours. May remove tomorrow.  Then cleanse area daily with soap and water.  Keep covered to keep protected and collect drainage.   Complete course of antibiotics.  Follow up with your primary care provider for recheck in two weeks as may need further evaluation if it persists.     ED Prescriptions    Medication Sig Dispense Auth. Provider   doxycycline (VIBRAMYCIN) 100 MG capsule Take 1 capsule (100 mg total) by mouth 2 (two) times daily. 20 capsule Georgetta Haber, NP     PDMP not reviewed this encounter.   Georgetta Haber, NP 02/07/20 1052

## 2020-02-07 NOTE — Discharge Instructions (Signed)
Apply heat to promote additional drainage.  Keep dressing in place for the next 24 hours. May remove tomorrow.  Then cleanse area daily with soap and water.  Keep covered to keep protected and collect drainage.   Complete course of antibiotics.  Follow up with your primary care provider for recheck in two weeks as may need further evaluation if it persists.

## 2020-02-07 NOTE — ED Triage Notes (Addendum)
Pt is here with a right breast lump/pain that she noticed 3 weeks ago, pt has not taken anything to relieve discomfort. Pt states she had 2 periods in August and that is not normal for her. Pt states she tested POSITIVE for COVID on 01/27/2020.

## 2020-02-22 ENCOUNTER — Encounter (HOSPITAL_COMMUNITY): Payer: Self-pay

## 2020-02-22 ENCOUNTER — Ambulatory Visit (HOSPITAL_COMMUNITY)
Admission: RE | Admit: 2020-02-22 | Discharge: 2020-02-22 | Disposition: A | Discharge status: Home / Self Care | Payer: Self-pay | Source: Ambulatory Visit

## 2020-02-22 ENCOUNTER — Other Ambulatory Visit: Payer: Self-pay

## 2020-02-22 VITALS — BP 143/100 | HR 83 | Temp 99.0°F | Resp 19

## 2020-02-22 DIAGNOSIS — L0291 Cutaneous abscess, unspecified: Secondary | ICD-10-CM

## 2020-02-22 NOTE — ED Provider Notes (Signed)
[facility]    CSN: [id number] Arrival date & time: 02/22/20  1500      History   Chief Complaint Chief Complaint  Patient presents with  . Abscess    APPT at 4:00    HPI Joan Moore is a 29 y.o. female.   Has an abscess on chest that was drained about 2 weeks ago but never fully went away. No fevers, body aches, sweats. Has been able to drain it herself at home since the procedure. Finished abx and has been using antibacterial soap at home.      Past Medical History:  Diagnosis Date  . Bipolar 1 disorder (HCC)   . Headache(784.0)   . Hypertension   . Obesity     Patient Active Problem List   Diagnosis Date Noted  . Globus sensation 04/22/2016  . Goiter 04/22/2016  . Abdominal pain 07/05/2014  . Emergency contraception 03/26/2014  . Hypertension 11/19/2010  . MIGRAINE HEADACHE 06/16/2010  . OBESITY 10/18/2007  . BIPOLAR DEPRESSION 10/18/2007    Past Surgical History:  Procedure Laterality Date  . CESAREAN SECTION    . CESAREAN SECTION N/A 07/16/2012   Procedure: Repeat cesarean section with delivery of baby  boy at 1535. Apgars 7/9.;  Surgeon: Willodean Rosenthal, MD;  Location: [facility];  Service: Obstetrics;  Laterality: N/A;    OB History    Gravida  2   Para  2   Term  2   Preterm  0   AB  0   Living  2     SAB  0   TAB  0   Ectopic  0   Multiple  0   Live Births  2            Home Medications    Prior to Admission medications   Medication Sig Start Date End Date Taking? Authorizing Provider  chlorhexidine (PERIDEX) 0.12 % solution Use as directed 15 mLs in the mouth or throat 2 (two) times daily. 08/26/19   Elvina Sidle, MD  diclofenac (VOLTAREN) 75 MG EC tablet Take 1 tablet (75 mg total) by mouth 2 (two) times daily. 08/26/19   Elvina Sidle, MD  doxycycline (VIBRAMYCIN) 100 MG capsule Take 1 capsule (100 mg total) by mouth 2 (two) times daily. 02/07/20   Georgetta Haber, NP    Family History Family  History  Problem Relation Age of Onset  . Diabetes Mother   . Hypertension Mother   . Diabetes Maternal Grandmother   . Hypertension Maternal Grandmother   . Diabetes Maternal Grandfather   . Hypertension Maternal Grandfather   . Other Neg Hx     Social History Social History   Tobacco Use  . Smoking status: Former Smoker    Packs/day: 0.25    Types: Cigarettes  . Smokeless tobacco: Never Used  Vaping Use  . Vaping Use: Never used  Substance Use Topics  . Alcohol use: No    Comment: occasional  . Drug use: No     Allergies   Patient has no known allergies.   Review of Systems Review of Systems PER HPI    Physical Exam Triage Vital Signs ED Triage Vitals  Enc Vitals Group     BP 02/22/20 1546 (!) 143/100     Pulse Rate 02/22/20 1546 83     Resp 02/22/20 1546 19     Temp 02/22/20 1546 99 F (37.2 C)     Temp Source 02/22/20 1546 Oral  SpO2 02/22/20 1546 98 %     Weight --      Height --      Head Circumference --      Peak Flow --      Pain Score 02/22/20 1545 0     Pain Loc --      Pain Edu? --      Excl. in GC? --    No data found.  Updated Vital Signs BP (!) 143/100 (BP Location: Left Wrist)   Pulse 83   Temp 99 F (37.2 C) (Oral)   Resp 19   LMP 02/01/2020   SpO2 98%   Visual Acuity Right Eye Distance:   Left Eye Distance:   Bilateral Distance:    Right Eye Near:   Left Eye Near:    Bilateral Near:     Physical Exam Vitals and nursing note reviewed.  Constitutional:      Appearance: Normal appearance. She is not ill-appearing.  HENT:     Head: Atraumatic.     Nose: Nose normal. No congestion.     Mouth/Throat:     Mouth: Mucous membranes are moist.  Eyes:     Extraocular Movements: Extraocular movements intact.     Conjunctiva/sclera: Conjunctivae normal.  Cardiovascular:     Rate and Rhythm: Normal rate and regular rhythm.     Heart sounds: Normal heart sounds.  Pulmonary:     Effort: Pulmonary effort is normal.      Breath sounds: Normal breath sounds.  Abdominal:     General: Bowel sounds are normal. There is no distension.     Palpations: Abdomen is soft.     Tenderness: There is no abdominal tenderness. There is no right CVA tenderness, left CVA tenderness or guarding.  Musculoskeletal:        General: Normal range of motion.     Cervical back: Normal range of motion and neck supple.  Skin:    General: Skin is warm and dry.     Comments: Non inflamed sebaceous cyst between breasts on chest, not open, draining, fluctuant, erythematous currently  Neurological:     Mental Status: She is alert and oriented to person, place, and time.  Psychiatric:        Mood and Affect: Mood normal.        Thought Content: Thought content normal.        Judgment: Judgment normal.    UC Treatments / Results  Labs (all labs ordered are listed, but only abnormal results are displayed) Labs Reviewed - No data to display  EKG   Radiology No results found.  Procedures Procedures (including critical care time)  Medications Ordered in UC Medications - No data to display  Initial Impression / Assessment and Plan / UC Course  I have reviewed the triage vital signs and the nursing notes.  Pertinent labs & imaging results that were available during my care of the patient were reviewed by me and considered in my medical decision making (see chart for details).     Not currently inflamed or infected. Discussed home care with warm compresses, keep area clean, and f/u with PCP for full excision.   Final Clinical Impressions(s) / UC Diagnoses   Final diagnoses:  Abscess   Discharge Instructions   None    ED Prescriptions    None     PDMP not reviewed this encounter.   Particia Nearing, [place] 02/22/20 1627

## 2020-02-22 NOTE — ED Triage Notes (Signed)
Pt presents for follow-up after abscess drainage. Pt was seen on 02/07/20 and was told to follow-up if not better.

## 2021-08-19 ENCOUNTER — Encounter (HOSPITAL_COMMUNITY): Payer: Self-pay

## 2021-08-19 ENCOUNTER — Ambulatory Visit (HOSPITAL_COMMUNITY)
Admission: EM | Admit: 2021-08-19 | Discharge: 2021-08-19 | Disposition: A | Discharge status: Home / Self Care | Payer: Self-pay | Attending: Family Medicine | Admitting: Family Medicine

## 2021-08-19 ENCOUNTER — Other Ambulatory Visit: Payer: Self-pay

## 2021-08-19 DIAGNOSIS — J019 Acute sinusitis, unspecified: Secondary | ICD-10-CM

## 2021-08-19 DIAGNOSIS — H6690 Otitis media, unspecified, unspecified ear: Secondary | ICD-10-CM

## 2021-08-19 DIAGNOSIS — R051 Acute cough: Secondary | ICD-10-CM

## 2021-08-19 MED ORDER — AMOXICILLIN-POT CLAVULANATE 875-125 MG PO TABS: 1.0000 | ORAL_TABLET | Freq: Two times a day (BID) | ORAL | 0 refills | Status: AC

## 2021-08-19 NOTE — ED Triage Notes (Signed)
Pt presents with lasting cough since February, patient states she is coughing up a yellow mucous. Pt states sore throat today. Pt states left ear pain. Pt took nyquil this morning. ?

## 2021-08-19 NOTE — ED Provider Notes (Signed)
?[facility] ? ? ? ?CSN: [id number] ?Arrival date & time: 08/19/21  1020 ? ? ?  ? ?History   ?Chief Complaint ?Chief Complaint  ?Patient presents with  ? Cough  ? Otalgia  ? ? ?HPI ?Joan Moore is a 31 y.o. female.  ? ?Patient is here for cough x several weeks.  No wheezing or sob.  Sore throat today.  Left ear pain since this morning.  ?Also with runny nose, congestion.  Hard time hearing out of the right ear.  ?No fevers.  + headaches.  ?This morning her right eye was pink, matted shut this morning.  ? ?Past Medical History:  ?Diagnosis Date  ? Bipolar 1 disorder (HCC)   ? Headache(784.0)   ? Hypertension   ? Obesity   ? ? ?Patient Active Problem List  ? Diagnosis Date Noted  ? Globus sensation 04/22/2016  ? Goiter 04/22/2016  ? Abdominal pain 07/05/2014  ? Emergency contraception 03/26/2014  ? Hypertension 11/19/2010  ? MIGRAINE HEADACHE 06/16/2010  ? OBESITY 10/18/2007  ? BIPOLAR DEPRESSION 10/18/2007  ? ? ?Past Surgical History:  ?Procedure Laterality Date  ? CESAREAN SECTION    ? CESAREAN SECTION N/A 07/16/2012  ? Procedure: Repeat cesarean section with delivery of baby  boy at 58. Apgars 7/9.;  Surgeon: Willodean Rosenthal, MD;  Location: [facility];  Service: Obstetrics;  Laterality: N/A;  ? ? ?OB History   ? ? Gravida  ?2  ? Para  ?2  ? Term  ?2  ? Preterm  ?0  ? AB  ?0  ? Living  ?2  ?  ? ? SAB  ?0  ? IAB  ?0  ? Ectopic  ?0  ? Multiple  ?0  ? Live Births  ?2  ?   ?  ?  ? ? ? ?Home Medications   ? ?Prior to Admission medications   ?Medication Sig Start Date End Date Taking? Authorizing Provider  ?chlorhexidine (PERIDEX) 0.12 % solution Use as directed 15 mLs in the mouth or throat 2 (two) times daily. 08/26/19   Elvina Sidle, MD  ?diclofenac (VOLTAREN) 75 MG EC tablet Take 1 tablet (75 mg total) by mouth 2 (two) times daily. 08/26/19   Elvina Sidle, MD  ?doxycycline (VIBRAMYCIN) 100 MG capsule Take 1 capsule (100 mg total) by mouth 2 (two) times daily. 02/07/20   Georgetta Haber, NP   ? ? ?Family History ?Family History  ?Problem Relation Age of Onset  ? Diabetes Mother   ? Hypertension Mother   ? Diabetes Maternal Grandmother   ? Hypertension Maternal Grandmother   ? Diabetes Maternal Grandfather   ? Hypertension Maternal Grandfather   ? Other Neg Hx   ? ? ?Social History ?Social History  ? ?Tobacco Use  ? Smoking status: Former  ?  Packs/day: 0.25  ?  Types: Cigarettes  ? Smokeless tobacco: Never  ?Vaping Use  ? Vaping Use: Never used  ?Substance Use Topics  ? Alcohol use: No  ?  Comment: occasional  ? Drug use: No  ? ? ? ?Allergies   ?Patient has no known allergies. ? ? ?Review of Systems ?Review of Systems  ?Constitutional:  Negative for chills and fever.  ?HENT:  Positive for congestion, ear pain and sinus pressure.   ?Eyes:  Positive for discharge and redness.  ?Respiratory:  Positive for cough. Negative for wheezing.   ?Cardiovascular: Negative.   ?Gastrointestinal: Negative.   ? ? ?Physical Exam ?Triage Vital Signs ?ED Triage Vitals  ?  Enc Vitals Group  ?   BP 08/19/21 1116 (!) 144/82  ?   Pulse Rate 08/19/21 1116 77  ?   Resp 08/19/21 1116 20  ?   Temp 08/19/21 1116 98.1 ?F (36.7 ?C)  ?   Temp Source 08/19/21 1116 Oral  ?   SpO2 08/19/21 1116 100 %  ?   Weight --   ?   Height --   ?   Head Circumference --   ?   Peak Flow --   ?   Pain Score 08/19/21 1118 5  ?   Pain Loc --   ?   Pain Edu? --   ?   Excl. in GC? --   ? ?No data found. ? ?Updated Vital Signs ?BP (!) 144/82 (BP Location: Right Arm)   Pulse 77   Temp 98.1 ?F (36.7 ?C) (Oral)   Resp 20   LMP 07/19/2021   SpO2 100%  ? ?Visual Acuity ?Right Eye Distance:   ?Left Eye Distance:   ?Bilateral Distance:   ? ?Right Eye Near:   ?Left Eye Near:    ?Bilateral Near:    ? ?Physical Exam ?Constitutional:   ?   Appearance: Normal appearance.  ?HENT:  ?   Head: Normocephalic and atraumatic.  ?   Nose:  ?   Left Sinus: Maxillary sinus tenderness and frontal sinus tenderness present.  ?   Mouth/Throat:  ?   Mouth: Mucous membranes are  moist.  ?   Pharynx: No oropharyngeal exudate or posterior oropharyngeal erythema.  ?Eyes:  ?   Funduscopic exam: ?   Right eye: No exudate.     ?   Left eye: No exudate.  ?Cardiovascular:  ?   Rate and Rhythm: Normal rate and regular rhythm.  ?Pulmonary:  ?   Effort: Pulmonary effort is normal.  ?   Breath sounds: Normal breath sounds.  ?Musculoskeletal:  ?   Cervical back: Normal range of motion and neck supple. Tenderness present.  ?Neurological:  ?   Mental Status: She is alert.  ? ? ? ?UC Treatments / Results  ?Labs ?(all labs ordered are listed, but only abnormal results are displayed) ?Labs Reviewed - No data to display ? ?EKG ? ? ?Radiology ?No results found. ? ?Procedures ?Procedures (including critical care time) ? ?Medications Ordered in UC ?Medications - No data to display ? ?Initial Impression / Assessment and Plan / UC Course  ?I have reviewed the triage vital signs and the nursing notes. ? ?Pertinent labs & imaging results that were available during my care of the patient were reviewed by me and considered in my medical decision making (see chart for details). ? ?  ?Final Clinical Impressions(s) / UC Diagnoses  ? ?Final diagnoses:  ?Acute otitis media, unspecified otitis media type  ?Acute non-recurrent sinusitis, unspecified location  ?Acute cough  ? ? ? ?Discharge Instructions   ? ?  ?You were seen today for upper respiratory symptoms for several weeks.  I have diagnosed you with a sinus infection and ear infection.  I have sent out an antibiotic to take twice/day x 7 days.  You may take over the counter zyrtec, flonase and mucinex to help with your symptoms.  Tylenol and motrin for pain.  ?Please follow up if not improving or worsening over time.  ? ? ? ?ED Prescriptions   ? ? Medication Sig Dispense Auth. Provider  ? amoxicillin-clavulanate (AUGMENTIN) 875-125 MG tablet Take 1 tablet by mouth every 12 (twelve) hours.  14 tablet Jannifer FranklinPiontek, Kaydance Bowie, MD  ? ?  ? ?PDMP not reviewed this encounter. ?   Jannifer Franklin?Gerarda Conklin, MD ?08/19/21 1157 ? ?

## 2021-08-19 NOTE — Discharge Instructions (Signed)
You were seen today for upper respiratory symptoms for several weeks.  I have diagnosed you with a sinus infection and ear infection.  I have sent out an antibiotic to take twice/day x 7 days.  You may take over the counter zyrtec, flonase and mucinex to help with your symptoms.  Tylenol and motrin for pain.  ?Please follow up if not improving or worsening over time.  ?

## 2023-04-09 DIAGNOSIS — Z419 Encounter for procedure for purposes other than remedying health state, unspecified: Secondary | ICD-10-CM | POA: Diagnosis not present

## 2023-05-09 DIAGNOSIS — Z419 Encounter for procedure for purposes other than remedying health state, unspecified: Secondary | ICD-10-CM | POA: Diagnosis not present

## 2023-06-09 DIAGNOSIS — Z419 Encounter for procedure for purposes other than remedying health state, unspecified: Secondary | ICD-10-CM | POA: Diagnosis not present

## 2023-07-10 DIAGNOSIS — Z419 Encounter for procedure for purposes other than remedying health state, unspecified: Secondary | ICD-10-CM | POA: Diagnosis not present

## 2023-08-07 DIAGNOSIS — Z419 Encounter for procedure for purposes other than remedying health state, unspecified: Secondary | ICD-10-CM | POA: Diagnosis not present

## 2023-09-14 ENCOUNTER — Ambulatory Visit
Admission: EM | Admit: 2023-09-14 | Discharge: 2023-09-14 | Disposition: A | Discharge status: Home / Self Care | Attending: Family Medicine | Admitting: Family Medicine

## 2023-09-14 DIAGNOSIS — T543X1A Toxic effect of corrosive alkalis and alkali-like substances, accidental (unintentional), initial encounter: Secondary | ICD-10-CM

## 2023-09-14 DIAGNOSIS — T2661XA Corrosion of cornea and conjunctival sac, right eye, initial encounter: Secondary | ICD-10-CM | POA: Diagnosis not present

## 2023-09-14 DIAGNOSIS — H5711 Ocular pain, right eye: Secondary | ICD-10-CM

## 2023-09-14 MED ORDER — ERYTHROMYCIN 5 MG/GM OP OINT: TOPICAL_OINTMENT | Freq: Four times a day (QID) | OPHTHALMIC | 0 refills | Status: AC

## 2023-09-14 NOTE — ED Provider Notes (Signed)
 [facility]  Note:  This document was prepared using Conservation officer, historic buildings and may include unintentional dictation errors.  MRN: [id number] DOB: 04/11/1991  Subjective:   Joan Moore is a 33 y.o. female presenting for reports suffering a chemical burn to the right eye at approximately 9 this morning.  Patient reports that she was handling a bottle of bleach and excellently dropped it.  As this happened, the bottle popped and ended up causing a splash to the right eye.  Patient rinsed out her eyes copiously for about 10 minutes.  Unfortunately, her symptoms have continued with increasing progressive pain, eye watering and redness.  Has also developed a headache from her pain.  Has not used any particular treatments to her eyes.  No current facility-administered medications for this encounter.  Current Outpatient Medications:    amoxicillin-clavulanate (AUGMENTIN) 875-125 MG tablet, Take 1 tablet by mouth every 12 (twelve) hours., Disp: 14 tablet, Rfl: 0   chlorhexidine (PERIDEX) 0.12 % solution, Use as directed 15 mLs in the mouth or throat 2 (two) times daily., Disp: 120 mL, Rfl: 0   diclofenac (VOLTAREN) 75 MG EC tablet, Take 1 tablet (75 mg total) by mouth 2 (two) times daily., Disp: 14 tablet, Rfl: 0   No Known Allergies  Past Medical History:  Diagnosis Date   Bipolar 1 disorder (HCC)    Headache(784.0)    Hypertension    Obesity      Past Surgical History:  Procedure Laterality Date   CESAREAN SECTION     CESAREAN SECTION N/A 07/16/2012   Procedure: Repeat cesarean section with delivery of baby  boy at 12. Apgars 7/9.;  Surgeon: Willodean Rosenthal, MD;  Location: [facility];  Service: Obstetrics;  Laterality: N/A;    Family History  Problem Relation Age of Onset   Diabetes Mother    Hypertension Mother    Diabetes Maternal Grandmother    Hypertension Maternal Grandmother    Diabetes Maternal Grandfather    Hypertension Maternal  Grandfather    Other Neg Hx     Social History   Tobacco Use   Smoking status: Former    Current packs/day: 0.25    Types: Cigarettes   Smokeless tobacco: Never  Vaping Use   Vaping status: Never Used  Substance Use Topics   Alcohol use: Yes    Comment: occasional   Drug use: No    ROS   Objective:   Vitals: Pulse (P) 74   Temp (P) 98.2 F (36.8 C) (Oral)   Resp (P) 20   LMP 09/07/2023   SpO2 (P) 94%   Physical Exam Constitutional:      General: She is not in acute distress.    Appearance: Normal appearance. She is well-developed. She is not ill-appearing, toxic-appearing or diaphoretic.  HENT:     Head: Normocephalic and atraumatic.     Nose: Nose normal.     Mouth/Throat:     Mouth: Mucous membranes are moist.  Eyes:     General: Lids are normal. Lids are everted, no foreign bodies appreciated. Vision grossly intact. No scleral icterus.       Right eye: No foreign body, discharge or hordeolum.        Left eye: No foreign body, discharge or hordeolum.     Extraocular Movements: Extraocular movements intact.     Right eye: Normal extraocular motion.     Left eye: Normal extraocular motion and no nystagmus.     Conjunctiva/sclera:  Right eye: Right conjunctiva is injected. No chemosis, exudate or hemorrhage.    Left eye: Left conjunctiva is not injected. No chemosis, exudate or hemorrhage.  Cardiovascular:     Rate and Rhythm: Normal rate.  Pulmonary:     Effort: Pulmonary effort is normal.  Skin:    General: Skin is warm and dry.  Neurological:     General: No focal deficit present.     Mental Status: She is alert and oriented to person, place, and time.  Psychiatric:        Mood and Affect: Mood normal.        Behavior: Behavior normal.        Thought Content: Thought content normal.        Judgment: Judgment normal.    pH tested and was ~7. Eye Exam: Eyelids everted and swept for foreign body. The eye was stained with fluorescein. Examination  under woods lamp does reveal an area of increased stain uptake over the area outlined. The eye was then irrigated copiously with saline.   Assessment and Plan :   PDMP not reviewed this encounter.  1. Acute right eye pain   2. Alkaline chemical burn of right conjunctiva, initial encounter    Recommend starting erythromycin ophthalmic ointment and follow-up as soon as possible with the ophthalmologist on-call through [facility], Dr. Sherryll Burger. Counseled patient on potential for adverse effects with medications prescribed/recommended today, ER and return-to-clinic precautions discussed, patient verbalized understanding.    Wallis Bamberg, [place] 09/14/23 1610

## 2023-09-14 NOTE — Discharge Instructions (Addendum)
 Please start erythromycin ophthalmic ointment 4 times daily.  Follow-up with Dr. Sherryll Burger as soon as possible.

## 2023-09-14 NOTE — ED Triage Notes (Signed)
 Pt c/o bleach splash to right eye ~930 am-states she flushed eye x 10 min-c/o pain around right eye-no pain meds PTA-NAD-steady gait

## 2023-09-18 DIAGNOSIS — Z419 Encounter for procedure for purposes other than remedying health state, unspecified: Secondary | ICD-10-CM | POA: Diagnosis not present

## 2023-10-18 DIAGNOSIS — Z419 Encounter for procedure for purposes other than remedying health state, unspecified: Secondary | ICD-10-CM | POA: Diagnosis not present

## 2023-11-18 DIAGNOSIS — Z419 Encounter for procedure for purposes other than remedying health state, unspecified: Secondary | ICD-10-CM | POA: Diagnosis not present

## 2023-12-18 DIAGNOSIS — Z419 Encounter for procedure for purposes other than remedying health state, unspecified: Secondary | ICD-10-CM | POA: Diagnosis not present

## 2024-01-18 DIAGNOSIS — Z419 Encounter for procedure for purposes other than remedying health state, unspecified: Secondary | ICD-10-CM | POA: Diagnosis not present

## 2024-02-18 DIAGNOSIS — Z419 Encounter for procedure for purposes other than remedying health state, unspecified: Secondary | ICD-10-CM | POA: Diagnosis not present

## 2024-03-19 DIAGNOSIS — Z419 Encounter for procedure for purposes other than remedying health state, unspecified: Secondary | ICD-10-CM | POA: Diagnosis not present

## 2024-04-13 DIAGNOSIS — Z7689 Persons encountering health services in other specified circumstances: Secondary | ICD-10-CM | POA: Diagnosis not present

## 2024-04-18 DIAGNOSIS — Z7689 Persons encountering health services in other specified circumstances: Secondary | ICD-10-CM | POA: Diagnosis not present

## 2024-04-20 DIAGNOSIS — Z7689 Persons encountering health services in other specified circumstances: Secondary | ICD-10-CM | POA: Diagnosis not present

## 2024-04-26 DIAGNOSIS — Z7689 Persons encountering health services in other specified circumstances: Secondary | ICD-10-CM | POA: Diagnosis not present

## 2024-05-19 DIAGNOSIS — Z419 Encounter for procedure for purposes other than remedying health state, unspecified: Secondary | ICD-10-CM | POA: Diagnosis not present
